# Patient Record
Sex: Female | Born: 1980 | Race: White | Hispanic: No | Marital: Married | Smoking: Never smoker
Health system: Southern US, Community
[De-identification: ages and names within clinical notes are randomized; demographics above are authoritative.]

## PROBLEM LIST (undated history)

## (undated) ENCOUNTER — Inpatient Hospital Stay (HOSPITAL_COMMUNITY): Payer: Self-pay

## (undated) DIAGNOSIS — J45909 Unspecified asthma, uncomplicated: Secondary | ICD-10-CM

## (undated) DIAGNOSIS — T7840XA Allergy, unspecified, initial encounter: Secondary | ICD-10-CM

## (undated) DIAGNOSIS — M1990 Unspecified osteoarthritis, unspecified site: Secondary | ICD-10-CM

## (undated) DIAGNOSIS — E559 Vitamin D deficiency, unspecified: Secondary | ICD-10-CM

## (undated) DIAGNOSIS — F419 Anxiety disorder, unspecified: Secondary | ICD-10-CM

## (undated) DIAGNOSIS — A6000 Herpesviral infection of urogenital system, unspecified: Secondary | ICD-10-CM

## (undated) HISTORY — DX: Unspecified asthma, uncomplicated: J45.909

## (undated) HISTORY — DX: Herpesviral infection of urogenital system, unspecified: A60.00

## (undated) HISTORY — DX: Allergy, unspecified, initial encounter: T78.40XA

## (undated) HISTORY — DX: Anxiety disorder, unspecified: F41.9

## (undated) HISTORY — DX: Vitamin D deficiency, unspecified: E55.9

## (undated) HISTORY — DX: Unspecified osteoarthritis, unspecified site: M19.90

## (undated) HISTORY — PX: BLADDER SUSPENSION: SHX72

## (undated) HISTORY — PX: TUBAL LIGATION: SHX77

## (undated) HISTORY — PX: COSMETIC SURGERY: SHX468

## (undated) HISTORY — PX: EYE SURGERY: SHX253

---
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
## 2000-11-24 ENCOUNTER — Other Ambulatory Visit: Admission: RE | Admit: 2000-11-24 | Discharge: 2000-11-24 | Payer: Self-pay | Admitting: *Deleted

## 2001-11-29 ENCOUNTER — Other Ambulatory Visit: Admission: RE | Admit: 2001-11-29 | Discharge: 2001-11-29 | Payer: Self-pay | Admitting: Obstetrics and Gynecology

## 2002-11-24 ENCOUNTER — Other Ambulatory Visit: Admission: RE | Admit: 2002-11-24 | Discharge: 2002-11-24 | Payer: Self-pay | Admitting: Obstetrics & Gynecology

## 2003-11-26 ENCOUNTER — Other Ambulatory Visit: Admission: RE | Admit: 2003-11-26 | Discharge: 2003-11-26 | Payer: Self-pay | Admitting: *Deleted

## 2007-05-08 ENCOUNTER — Encounter: Admission: RE | Admit: 2007-05-08 | Discharge: 2007-05-08 | Payer: Self-pay | Admitting: *Deleted

## 2007-11-11 ENCOUNTER — Ambulatory Visit (HOSPITAL_COMMUNITY): Admission: RE | Admit: 2007-11-11 | Discharge: 2007-11-11 | Payer: Self-pay | Admitting: Neurosurgery

## 2013-12-27 ENCOUNTER — Other Ambulatory Visit: Payer: Self-pay

## 2013-12-27 ENCOUNTER — Other Ambulatory Visit: Payer: Self-pay | Admitting: Physician Assistant

## 2013-12-27 MED ORDER — ALPRAZOLAM 0.5 MG PO TABS: 0.5000 mg | ORAL_TABLET | Freq: Three times a day (TID) | ORAL | Status: DC | PRN

## 2014-04-07 ENCOUNTER — Other Ambulatory Visit: Payer: Self-pay | Admitting: Physician Assistant

## 2014-07-22 DIAGNOSIS — T7840XA Allergy, unspecified, initial encounter: Secondary | ICD-10-CM | POA: Insufficient documentation

## 2014-07-22 DIAGNOSIS — E559 Vitamin D deficiency, unspecified: Secondary | ICD-10-CM | POA: Insufficient documentation

## 2014-07-22 DIAGNOSIS — F419 Anxiety disorder, unspecified: Secondary | ICD-10-CM | POA: Insufficient documentation

## 2014-07-22 DIAGNOSIS — J45909 Unspecified asthma, uncomplicated: Secondary | ICD-10-CM | POA: Insufficient documentation

## 2014-07-22 DIAGNOSIS — A6000 Herpesviral infection of urogenital system, unspecified: Secondary | ICD-10-CM | POA: Insufficient documentation

## 2014-07-25 ENCOUNTER — Ambulatory Visit (INDEPENDENT_AMBULATORY_CARE_PROVIDER_SITE_OTHER): Payer: Managed Care, Other (non HMO) | Admitting: Physician Assistant

## 2014-07-25 ENCOUNTER — Encounter: Payer: Self-pay | Admitting: Physician Assistant

## 2014-07-25 VITALS — BP 100/58 | HR 72 | Temp 98.1°F | Resp 16 | Ht 62.0 in | Wt 120.0 lb

## 2014-07-25 DIAGNOSIS — Z0000 Encounter for general adult medical examination without abnormal findings: Secondary | ICD-10-CM

## 2014-07-25 LAB — CBC WITH DIFFERENTIAL/PLATELET
BASOS ABS: 0.1 10*3/uL (ref 0.0–0.1)
BASOS PCT: 1 % (ref 0–1)
Eosinophils Absolute: 0.4 10*3/uL (ref 0.0–0.7)
Eosinophils Relative: 5 % (ref 0–5)
HEMATOCRIT: 36.1 % (ref 36.0–46.0)
HEMOGLOBIN: 12.5 g/dL (ref 12.0–15.0)
Lymphocytes Relative: 36 % (ref 12–46)
Lymphs Abs: 2.9 10*3/uL (ref 0.7–4.0)
MCH: 30 pg (ref 26.0–34.0)
MCHC: 34.6 g/dL (ref 30.0–36.0)
MCV: 86.6 fL (ref 78.0–100.0)
MONO ABS: 0.5 10*3/uL (ref 0.1–1.0)
Monocytes Relative: 6 % (ref 3–12)
NEUTROS PCT: 52 % (ref 43–77)
Neutro Abs: 4.2 10*3/uL (ref 1.7–7.7)
Platelets: 269 10*3/uL (ref 150–400)
RBC: 4.17 MIL/uL (ref 3.87–5.11)
RDW: 13.1 % (ref 11.5–15.5)
WBC: 8 10*3/uL (ref 4.0–10.5)

## 2014-07-25 NOTE — Patient Instructions (Addendum)
Keeping You Healthy  Get These Tests 1. Blood Pressure- Have your blood pressure checked once a year by your health care provider.  Normal blood pressure is 120/80. 2. Weight- Have your body mass index (BMI) calculated to screen for obesity.  BMI is measure of body fat based on height and weight.  You can also calculate your own BMI at GravelBags.it. 3. Cholesterol- Have your cholesterol checked every 5 years starting at age 33 then yearly starting at age 77. 12. Chlamydia, HIV, and other sexually transmitted diseases- Get screened every year until age 104, then within three months of each new sexual provider. 5. Pap Smear- Every 1-3 years; discuss with your health care provider. 6. Mammogram- Every year starting at age 58  Take these medicines  Calcium with Vitamin D-Your body needs 1200 mg of Calcium each day and [phone] IU of Vitamin D daily.  Your body can only absorb 500 mg of Calcium at a time so Calcium must be taken in 2 or 3 divided doses throughout the day.  Multivitamin with folic acid- Once daily if it is possible for you to become pregnant.  Get these Immunizations  Gardasil-Series of three doses; prevents HPV related illness such as genital warts and cervical cancer.  Menactra-Single dose; prevents meningitis.  Tetanus shot- Every 10 years.  Flu shot-Every year.  Take these steps 1. Do not smoke-Your healthcare provider can help you quit.  For tips on how to quit go to www.smokefree.gov or call 1-800 QUITNOW. 2. Be physically active- Exercise 5 days a week for at least 30 minutes.  If you are not already physically active, start slow and gradually work up to 30 minutes of moderate physical activity.  Examples of moderate activity include walking briskly, dancing, swimming, bicycling, etc. 3. Breast Cancer- A self breast exam every month is important for early detection of breast cancer.  For more information and instruction on self breast exams, ask your  healthcare provider or https://www.patel.info/. 4. Eat a healthy diet- Eat a variety of healthy foods such as fruits, vegetables, whole grains, low fat milk, low fat cheeses, yogurt, lean meats, poultry and fish, beans, nuts, tofu, etc.  For more information go to www. Thenutritionsource.org 5. Drink alcohol in moderation- Limit alcohol intake to one drink or less per day. Never drink and drive. 6. Depression- Your emotional health is as important as your physical health.  If you're feeling down or losing interest in things you normally enjoy please talk to your healthcare provider about being screened for depression. 7. Dental visit- Brush and floss your teeth twice daily; visit your dentist twice a year. 8. Eye doctor- Get an eye exam at least every 2 years. 9. Helmet use- Always wear a helmet when riding a bicycle, motorcycle, rollerblading or skateboarding. 49. Safe sex- If you may be exposed to sexually transmitted infections, use a condom. 11. Seat belts- Seat belts can save your live; always wear one. 12. Smoke/Carbon Monoxide detectors- These detectors need to be installed on the appropriate level of your home. Replace batteries at least once a year. 13. Skin cancer- When out in the sun please cover up and use sunscreen 15 SPF or higher. 14. Violence- If anyone is threatening or hurting you, please tell your healthcare provider.        Kegel Exercises The goal of Kegel exercises is to isolate and exercise your pelvic floor muscles. These muscles act as a hammock that supports the rectum, vagina, small intestine, and uterus. As the  muscles weaken, the hammock sags and these organs are displaced from their normal positions. Kegel exercises can strengthen your pelvic floor muscles and help you to improve bladder and bowel control, improve sexual response, and help reduce many problems and some discomfort during pregnancy. Kegel exercises can be done anywhere and at any  time. HOW TO PERFORM KEGEL EXERCISES 1. Locate your pelvic floor muscles. To do this, squeeze (contract) the muscles that you use when you try to stop the flow of urine. You will feel a tightness in the vaginal area (women) and a tight lift in the rectal area (men and women). 2. When you begin, contract your pelvic muscles tight for 2-5 seconds, then relax them for 2-5 seconds. This is one set. Do 4-5 sets with a short pause in between. 3. Contract your pelvic muscles for 8-10 seconds, then relax them for 8-10 seconds. Do 4-5 sets. If you cannot contract your pelvic muscles for 8-10 seconds, try 5-7 seconds and work your way up to 8-10 seconds. Your goal is 4-5 sets of 10 contractions each day. Keep your stomach, buttocks, and legs relaxed during the exercises. Perform sets of both short and long contractions. Vary your positions. Perform these contractions 3-4 times per day. Perform sets while you are:   Lying in bed in the morning.  Standing at lunch.  Sitting in the late afternoon.  Lying in bed at night. You should do 40-50 contractions per day. Do not perform more Kegel exercises per day than recommended. Overexercising can cause muscle fatigue. Continue these exercises for for at least 15-20 weeks or as directed by your caregiver. Document Released: 11/02/2012 Document Reviewed: 11/02/2012 [facility] Patient Information 2015 [place]. This information is not intended to replace advice given to you by your health care provider. Make sure you discuss any questions you have with your health care provider.

## 2014-07-25 NOTE — Progress Notes (Signed)
Complete Physical  Assessment and Plan: Allergy- take med PRN  Anxiety- continue celexa, xanax PRN  Asthma- remission  Herpesvirus 2- controlled  Vitamin D deficiency- continue medications    Discussed med's effects and SE's. Screening labs and tests as requested with regular follow-up as recommended.  HPI 33 y.o. female  presents for a complete physical.  Her blood pressure has been controlled at home, today their BP is BP: 100/58 mmHg She does workout sporadicly. She denies chest pain, shortness of breath, dizziness.  She is not on cholesterol medication and denies myalgias. Her cholesterol is at goal. The cholesterol last visit was:  LDL 67, Trigs 192, HDL 93 Last A1C in the office was: 5.4 Patient is on Vitamin D supplement.   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  Father recently passed very quickly due to Media.  She is married for 3 years, 1 daughter 74 years old and 1 step son 34, she is considering having another baby.  Since her last baby she has had some stress incontinence with coughing, sneezing, and exercise especially. Nocturia occ once, denies urgency, frequency.    She is on Celexa for anxiety and rarely takes xanax, mainly occ for sleep 1-2 times a week.   Current Medications:    Medication List       This list is accurate as of: 07/25/14  4:17 PM.  Always use your most recent med list.               ALPRAZolam 0.5 MG tablet  Commonly known as:  XANAX  TAKE 1 TABLET BY MOUTH THREE TIMES DAILY AS NEEDED     citalopram 10 MG tablet  Commonly known as:  CELEXA  Take 10 mg by mouth daily.     MULTIVITAMIN PO  Take by mouth daily.     ZYRTEC PO  Take 10 mg by mouth daily.        Health Maintenance:   Tetanus:2012 Pneumovax: Flu vaccine: 2014 Zostavax: Pap: 04/2014 at [facility]  MGM: DEXA: Colonoscopy: EGD:  Allergies: No Known Allergies Medical History:  Past Medical History  Diagnosis  Date  . Allergy   . Anxiety   . Asthma   . Herpesvirus 2   . Vitamin D deficiency    Surgical History: No past surgical history on file. Family History:  Family History  Problem Relation Age of Onset  . Hypertension Mother   . Anxiety disorder Mother   . Thyroid disease Mother   . Epilepsy Father   . ALS Father    Social History:  History  Substance Use Topics  . Smoking status: Never Smoker   . Smokeless tobacco: Never Used  . Alcohol Use: No   Review of Systems: [X]  = complains of  [ ]  = denies  General: Fatigue [ ]  Fever [ ]  Chills [ ]  Weakness [ ]   Insomnia [ ] Weight change [ ]  Night sweats [ ]   Change in appetite [ ]  Eyes: Redness [ ]  Blurred vision [ ]  Diplopia [ ]  Discharge [ ]   ENT: Congestion [ ]  Sinus Pain [ ]  Post Nasal Drip [ ]  Sore Throat [ ]  Earache [ ]  hearing loss [ ]  Tinnitus [ ]  Snoring [ ]   Cardiac: Chest pain/pressure [ ]  SOB [ ]  Orthopnea [ ]   Palpitations [ ]   Paroxysmal nocturnal dyspnea[ ]  Claudication [ ]  Edema [ ]   Pulmonary: Cough [ ]  Wheezing[ ]   SOB [ ]   Pleurisy [ ]   GI: Nausea [ ]  Vomiting[ ]  Dysphagia[ ]  Heartburn[ ]  Abdominal pain [ ]  Constipation [ ] ; Diarrhea [ ]  BRBPR [ ]  Melena[ ]  Bloating [ ]  Hemorrhoids [ ]   GU: Hematuria[ ]  Dysuria [ ]  Nocturia[ ]  Urgency [ ]   Hesitancy [ ]  Discharge [ ]  Frequency [ ]   Breast:  Breast lumps [ ]   nipple discharge [ ]    Neuro: Headaches[ ]  Vertigo[ ]  Paresthesias[ ]  Spasm [ ]  Speech changes [ ]  Incoordination [ ]   Ortho: Arthritis [ ]  Joint pain [ ]  Muscle pain [ ]  Joint swelling [ ]  Back Pain [ ]  Skin:  Rash [ ]   Pruritis [ ]  Change in skin lesion [ ]   Psych: Depression[ ]  Anxiety[x ] Confusion [ ]  Memory loss [ ]   Heme/Lypmh: Bleeding [ ]  Bruising [ ]  Enlarged lymph nodes [ ]   Endocrine: Visual blurring [ ]  Paresthesia [ ]  Polyuria [ ]  Polydypsea [ ]    Heat/cold intolerance [ ]  Hypoglycemia [ ]   Physical Exam: Estimated body mass index is 21.94 kg/(m^2) as calculated from the following:   Height as  of this encounter: 5\' 2"  (1.575 m).   Weight as of this encounter: 120 lb (54.432 kg). BP 100/58  Pulse 72  Temp(Src) 98.1 F (36.7 C)  Resp 16  Ht 5\' 2"  (1.575 m)  Wt 120 lb (54.432 kg)  BMI 21.94 kg/m2 General Appearance: Well nourished, in no apparent distress. Eyes: PERRLA, EOMs, conjunctiva no swelling or erythema, normal fundi and vessels. Sinuses: No Frontal/maxillary tenderness ENT/Mouth: Ext aud canals clear, normal light reflex with TMs without erythema, bulging.  Good dentition. No erythema, swelling, or exudate on post pharynx. Tonsils not swollen or erythematous. Hearing normal.  Neck: Supple, thyroid normal. No bruits Respiratory: Respiratory effort normal, BS equal bilaterally without rales, rhonchi, wheezing or stridor. Cardio: RRR without murmurs, rubs or gallops. Brisk peripheral pulses without edema.  Chest: symmetric, with normal excursions and percussion. Breasts: defer Abdomen: Soft, +BS. Non tender, no guarding, rebound, hernias, masses, or organomegaly. .  Lymphatics: Non tender without lymphadenopathy.  Genitourinary: defer Musculoskeletal: Full ROM all peripheral extremities,5/5 strength, and normal gait. Skin: Warm, dry without rashes, lesions, ecchymosis.  Neuro: Cranial nerves intact, reflexes equal bilaterally. Normal muscle tone, no cerebellar symptoms. Sensation intact.  Psych: Awake and oriented X 3, normal affect, Insight and Judgment appropriate.   EKG: declines   Journiee, Feldkamp 4:09 PM

## 2014-07-26 LAB — BASIC METABOLIC PANEL WITH GFR
BUN: 11 mg/dL (ref 6–23)
CO2: 27 meq/L (ref 19–32)
CREATININE: 0.7 mg/dL (ref 0.50–1.10)
Calcium: 9.4 mg/dL (ref 8.4–10.5)
Chloride: 100 mEq/L (ref 96–112)
GFR, Est Non African American: >89 mL/min
Glucose, Bld: 111 mg/dL — ABNORMAL HIGH (ref 70–99)
Potassium: 3.9 mEq/L (ref 3.5–5.3)
SODIUM: 138 meq/L (ref 135–145)

## 2014-07-26 LAB — HEPATIC FUNCTION PANEL
ALBUMIN: 4.6 g/dL (ref 3.5–5.2)
ALT: 11 U/L (ref 0–35)
AST: 17 U/L (ref 0–37)
Alkaline Phosphatase: 59 U/L (ref 39–117)
BILIRUBIN INDIRECT: 0.3 mg/dL (ref 0.2–1.2)
Bilirubin, Direct: 0.1 mg/dL (ref 0.0–0.3)
TOTAL PROTEIN: 7.2 g/dL (ref 6.0–8.3)
Total Bilirubin: 0.4 mg/dL (ref 0.2–1.2)

## 2014-07-26 LAB — HEMOGLOBIN A1C
Hgb A1c MFr Bld: 5.5 % (ref <5.7)
Mean Plasma Glucose: 111 mg/dL (ref <117)

## 2014-07-26 LAB — LIPID PANEL
CHOL/HDL RATIO: 3.6 ratio
CHOLESTEROL: 143 mg/dL (ref 0–200)
HDL: 40 mg/dL (ref >39)
LDL Cholesterol: 81 mg/dL (ref 0–99)
Triglycerides: 112 mg/dL (ref <150)
VLDL: 22 mg/dL (ref 0–40)

## 2014-07-26 LAB — TSH: TSH: 0.817 u[IU]/mL (ref 0.350–4.500)

## 2014-07-26 LAB — VITAMIN B12: VITAMIN B 12: 386 pg/mL (ref 211–911)

## 2014-07-26 LAB — IRON AND TIBC
%SAT: 14 % — AB (ref 20–55)
Iron: 51 ug/dL (ref 42–145)
TIBC: 359 ug/dL (ref 250–470)
UIBC: 308 ug/dL (ref 125–400)

## 2014-07-26 LAB — VITAMIN D 25 HYDROXY (VIT D DEFICIENCY, FRACTURES): VIT D 25 HYDROXY: 40 ng/mL (ref 30–89)

## 2014-07-26 LAB — FERRITIN: FERRITIN: 19 ng/mL (ref 10–291)

## 2014-07-26 LAB — INSULIN, FASTING: INSULIN FASTING, SERUM: 20.2 u[IU]/mL — AB (ref 2.0–19.6)

## 2014-07-26 LAB — MAGNESIUM: MAGNESIUM: 2 mg/dL (ref 1.5–2.5)

## 2014-11-30 NOTE — L&D Delivery Note (Signed)
Delivery Note  First Stage: Labor induction started @ 0200 Onset of active labor: 1000 Analgesia /Anesthesia intrapartum: epidural AROM - A at 1000  Second Stage - CNM and Dr Ronita Hipps at bedside Complete dilation at 1315 Onset of pushing at 1320 FHR x2 second stage - category 1  Delivery of a viable TWIN A - FEMALE at 1406 by CNM in LOA position no nuchal cord Cord double clamped after cessation of pulsation, cut by FOB  Bedside sono by Dr Ronita Hipps - vtx presentation of TWIN B AROM - 1408 - clear / vtx -2 station Ctx decrease to 10 minutes apart - pitocin increased   delivery of a viable TWIN B - FEMALE at 1506 by CNM in LOA position large amount of blood and clots C/w abruption at time of newborn delivery no nuchal cord Cord double clamped and cut by provider - to warmer for initial assessment and supportive care - NICU team called  Arterial cord gas obtained - twin B cord: 7.11 ph Cord blood sample collected Twin A cord & placed in A-warmer and Twin B cord sample collected & placed in B-warmer  Third Stage: Placentas: twin-B delivered intact with @ 1512 just prior to twin-A placenta Placenta disposition: pathology Uterine tone boggy / bleeding moderate - pitocin IVF bolus / uterine massage / cytotec 850mcg PR   2nd degree perineal laceration identified  Anesthesia for repair: epidural Repair 3-0 vicryl and 4-0 vicryl Est. Blood Loss (mL): 1093AT  Complications: abruption Twin - B with acute bleeding at delivery  Mom to postpartum.  Baby to Couplet care / Skin to Skin.  Newborn- A: Birth Weight: 6-12 Apgar Scores: 8-9 Feeding: breast and bottle  Newborn- B: Birth Weight: 6-6  Apgar Scores: 2-7-9 Feeding: breast and bottle  Artelia Laroche CNM, MSN, [place] 07/22/2015, 4:22 PM

## 2014-12-31 ENCOUNTER — Other Ambulatory Visit: Payer: Self-pay

## 2014-12-31 MED ORDER — ACYCLOVIR 800 MG PO TABS: ORAL_TABLET | ORAL | Status: DC

## 2015-01-16 LAB — OB RESULTS CONSOLE HIV ANTIBODY (ROUTINE TESTING): HIV: NONREACTIVE

## 2015-01-16 LAB — OB RESULTS CONSOLE ANTIBODY SCREEN: Antibody Screen: NEGATIVE

## 2015-01-16 LAB — OB RESULTS CONSOLE ABO/RH: RH Type: POSITIVE

## 2015-01-16 LAB — OB RESULTS CONSOLE RPR: RPR: NONREACTIVE

## 2015-01-16 LAB — OB RESULTS CONSOLE HEPATITIS B SURFACE ANTIGEN: Hepatitis B Surface Ag: NEGATIVE

## 2015-01-16 LAB — OB RESULTS CONSOLE RUBELLA ANTIBODY, IGM: Rubella: IMMUNE

## 2015-02-04 LAB — OB RESULTS CONSOLE GC/CHLAMYDIA
CHLAMYDIA, DNA PROBE: NEGATIVE
GC PROBE AMP, GENITAL: NEGATIVE

## 2015-05-06 ENCOUNTER — Encounter (HOSPITAL_COMMUNITY): Payer: Self-pay | Admitting: *Deleted

## 2015-05-06 ENCOUNTER — Inpatient Hospital Stay (HOSPITAL_COMMUNITY)
Admission: AD | Admit: 2015-05-06 | Discharge: 2015-05-06 | Disposition: A | Discharge status: Home / Self Care | Payer: Managed Care, Other (non HMO) | Source: Ambulatory Visit | Attending: Obstetrics and Gynecology | Admitting: Obstetrics and Gynecology

## 2015-05-06 DIAGNOSIS — R109 Unspecified abdominal pain: Secondary | ICD-10-CM | POA: Diagnosis present

## 2015-05-06 DIAGNOSIS — Z3A26 26 weeks gestation of pregnancy: Secondary | ICD-10-CM | POA: Diagnosis not present

## 2015-05-06 DIAGNOSIS — M549 Dorsalgia, unspecified: Secondary | ICD-10-CM | POA: Diagnosis not present

## 2015-05-06 DIAGNOSIS — O9989 Other specified diseases and conditions complicating pregnancy, childbirth and the puerperium: Secondary | ICD-10-CM | POA: Insufficient documentation

## 2015-05-06 DIAGNOSIS — K5900 Constipation, unspecified: Secondary | ICD-10-CM | POA: Insufficient documentation

## 2015-05-06 MED ORDER — MAGNESIUM 200 MG PO TABS: 1.0000 | ORAL_TABLET | Freq: Every day | ORAL | Status: DC

## 2015-05-06 NOTE — MAU Note (Signed)
Pt reports some contractions since last night, some nausea. Mucous discharge.

## 2015-05-06 NOTE — MAU Provider Note (Signed)
  History     CSN: [id number]  Arrival date and time: 05/06/15 2012 Nurse call to provider @ 2038 Provider here to see patient at 2045   No chief complaint on file.  HPI  Nausea Passed mucus plug Some cramping x 2 days  Past Medical History  Diagnosis Date  . Allergy   . Anxiety   . Asthma   . Herpesvirus 2   . Vitamin D deficiency     No past surgical history on file.  Family History  Problem Relation Age of Onset  . Hypertension Mother   . Anxiety disorder Mother   . Thyroid disease Mother   . Epilepsy Father   . ALS Father     History  Substance Use Topics  . Smoking status: Never Smoker   . Smokeless tobacco: Never Used  . Alcohol Use: No    Allergies: No Known Allergies  Prescriptions prior to admission  Medication Sig Dispense Refill Last Dose  . acyclovir (ZOVIRAX) 800 MG tablet Take one tablet twice daily for five days as needed for outbreak 30 tablet 3   . ALPRAZolam (XANAX) 0.5 MG tablet TAKE 1 TABLET BY MOUTH THREE TIMES DAILY AS NEEDED 90 tablet 0 Taking  . Cetirizine HCl (ZYRTEC PO) Take 10 mg by mouth daily.   Taking  . citalopram (CELEXA) 10 MG tablet Take 10 mg by mouth daily.   Taking  . Multiple Vitamins-Minerals (MULTIVITAMIN PO) Take by mouth daily.   Taking    ROS  Cramps Some braxton-hicks No bleeding Passed mucus discharge this afternoon - thinks it is her mucus plug and is a sign of labor  Physical Exam   Blood pressure 107/72, pulse 90, temperature 98.4 F (36.9 C), temperature source Oral, resp. rate 18, height 5\' 4"  (1.626 m), weight 68.493 kg (151 lb), SpO2 100 %.  Physical Exam  Anxious / NAD or pain Abdomen soft and non-tender / gravid and tight with short waist SSE: white vaginal discharge - no mucus seen            Cervix appear closed / mucus in os / no bleeding VE: closed / soft / no LUSD / cervical length~ 3cm  FHR A - 155 FHR B - 150  toco - no ctx  MAU Course  Procedures   Assessment and Plan  26  weeks - vaginal discharge No evidence of PTL Backache Constipation  1) rest in evening / tub soaks / pool access 2) MORE water - not drinking enough water daily 3) magnesium 200-400mg  daily for constipation management 4) DC home  Artelia Laroche 05/06/2015, 9:02 PM

## 2015-07-03 LAB — OB RESULTS CONSOLE GBS: STREP GROUP B AG: NEGATIVE

## 2015-07-15 ENCOUNTER — Telehealth (HOSPITAL_COMMUNITY): Payer: Self-pay | Admitting: *Deleted

## 2015-07-15 ENCOUNTER — Encounter (HOSPITAL_COMMUNITY): Payer: Self-pay | Admitting: *Deleted

## 2015-07-15 NOTE — Telephone Encounter (Signed)
Preadmission screen  

## 2015-07-16 ENCOUNTER — Telehealth (HOSPITAL_COMMUNITY): Payer: Self-pay | Admitting: *Deleted

## 2015-07-16 ENCOUNTER — Encounter (HOSPITAL_COMMUNITY): Payer: Self-pay | Admitting: *Deleted

## 2015-07-16 NOTE — Telephone Encounter (Signed)
Preadmission screen  

## 2015-07-22 ENCOUNTER — Encounter (HOSPITAL_COMMUNITY): Payer: Self-pay

## 2015-07-22 ENCOUNTER — Inpatient Hospital Stay (HOSPITAL_COMMUNITY)
Admission: RE | Admit: 2015-07-22 | Discharge: 2015-07-24 | DRG: 774 | Disposition: A | Discharge status: Home / Self Care | Payer: Managed Care, Other (non HMO) | Source: Ambulatory Visit | Attending: Obstetrics and Gynecology | Admitting: Obstetrics and Gynecology

## 2015-07-22 ENCOUNTER — Inpatient Hospital Stay (HOSPITAL_COMMUNITY): Payer: Managed Care, Other (non HMO) | Admitting: Anesthesiology

## 2015-07-22 DIAGNOSIS — O26893 Other specified pregnancy related conditions, third trimester: Secondary | ICD-10-CM | POA: Diagnosis present

## 2015-07-22 DIAGNOSIS — Z3A37 37 weeks gestation of pregnancy: Secondary | ICD-10-CM | POA: Diagnosis present

## 2015-07-22 DIAGNOSIS — IMO0001 Reserved for inherently not codable concepts without codable children: Secondary | ICD-10-CM

## 2015-07-22 DIAGNOSIS — O322XX2 Maternal care for transverse and oblique lie, fetus 2: Secondary | ICD-10-CM | POA: Diagnosis present

## 2015-07-22 DIAGNOSIS — D62 Acute posthemorrhagic anemia: Secondary | ICD-10-CM | POA: Diagnosis present

## 2015-07-22 DIAGNOSIS — O9962 Diseases of the digestive system complicating childbirth: Secondary | ICD-10-CM | POA: Diagnosis present

## 2015-07-22 DIAGNOSIS — K219 Gastro-esophageal reflux disease without esophagitis: Secondary | ICD-10-CM | POA: Diagnosis present

## 2015-07-22 DIAGNOSIS — O9081 Anemia of the puerperium: Secondary | ICD-10-CM | POA: Diagnosis present

## 2015-07-22 DIAGNOSIS — O4593 Premature separation of placenta, unspecified, third trimester: Secondary | ICD-10-CM | POA: Diagnosis present

## 2015-07-22 DIAGNOSIS — E559 Vitamin D deficiency, unspecified: Secondary | ICD-10-CM | POA: Diagnosis present

## 2015-07-22 DIAGNOSIS — Z8249 Family history of ischemic heart disease and other diseases of the circulatory system: Secondary | ICD-10-CM

## 2015-07-22 DIAGNOSIS — O30043 Twin pregnancy, dichorionic/diamniotic, third trimester: Secondary | ICD-10-CM | POA: Diagnosis present

## 2015-07-22 LAB — CBC
HCT: 34.8 % — ABNORMAL LOW (ref 36.0–46.0)
HEMATOCRIT: 33.5 % — AB (ref 36.0–46.0)
HEMATOCRIT: 31.4 % — AB (ref 36.0–46.0)
HEMOGLOBIN: 11.2 g/dL — AB (ref 12.0–15.0)
Hemoglobin: 11.7 g/dL — ABNORMAL LOW (ref 12.0–15.0)
Hemoglobin: 10.5 g/dL — ABNORMAL LOW (ref 12.0–15.0)
MCH: 31.2 pg (ref 26.0–34.0)
MCH: 31.5 pg (ref 26.0–34.0)
MCH: 31.2 pg (ref 26.0–34.0)
MCHC: 33.6 g/dL (ref 30.0–36.0)
MCHC: 33.4 g/dL (ref 30.0–36.0)
MCHC: 33.4 g/dL (ref 30.0–36.0)
MCV: 92.8 fL (ref 78.0–100.0)
MCV: 94.1 fL (ref 78.0–100.0)
MCV: 93.2 fL (ref 78.0–100.0)
Platelets: 143 10*3/uL — ABNORMAL LOW (ref 150–400)
Platelets: 142 10*3/uL — ABNORMAL LOW (ref 150–400)
Platelets: 142 10*3/uL — ABNORMAL LOW (ref 150–400)
RBC: 3.75 MIL/uL — ABNORMAL LOW (ref 3.87–5.11)
RBC: 3.56 MIL/uL — ABNORMAL LOW (ref 3.87–5.11)
RBC: 3.37 MIL/uL — AB (ref 3.87–5.11)
RDW: 14.1 % (ref 11.5–15.5)
RDW: 14.3 % (ref 11.5–15.5)
RDW: 14.2 % (ref 11.5–15.5)
WBC: 9.2 10*3/uL (ref 4.0–10.5)
WBC: 8.7 10*3/uL (ref 4.0–10.5)
WBC: 17.9 10*3/uL — ABNORMAL HIGH (ref 4.0–10.5)

## 2015-07-22 LAB — RPR: RPR Ser Ql: NONREACTIVE

## 2015-07-22 LAB — TYPE AND SCREEN
ABO/RH(D): A POS
Antibody Screen: NEGATIVE

## 2015-07-22 LAB — ABO/RH: ABO/RH(D): A POS

## 2015-07-22 MED ORDER — ONDANSETRON HCL 4 MG PO TABS
4.0000 mg | ORAL_TABLET | ORAL | Status: DC | PRN
  Administered 2015-07-22: 4 mg via ORAL
  Filled 2015-07-22: qty 1

## 2015-07-22 MED ORDER — PHENYLEPHRINE 40 MCG/ML (10ML) SYRINGE FOR IV PUSH (FOR BLOOD PRESSURE SUPPORT)
80.0000 ug | PREFILLED_SYRINGE | INTRAVENOUS | Status: DC | PRN
  Filled 2015-07-22: qty 2

## 2015-07-22 MED ORDER — DIPHENHYDRAMINE HCL 50 MG/ML IJ SOLN
12.5000 mg | INTRAMUSCULAR | Status: DC | PRN
  Administered 2015-07-22: 12.5 mg via INTRAVENOUS

## 2015-07-22 MED ORDER — OXYTOCIN BOLUS FROM INFUSION
500.0000 mL | INTRAVENOUS | Status: DC
  Administered 2015-07-22: 500 mL via INTRAVENOUS

## 2015-07-22 MED ORDER — OXYTOCIN 40 UNITS IN LACTATED RINGERS INFUSION - SIMPLE MED: 62.5000 mL/h | INTRAVENOUS | Status: DC

## 2015-07-22 MED ORDER — LACTATED RINGERS IV SOLN
500.0000 mL | INTRAVENOUS | Status: DC | PRN
  Administered 2015-07-22: 500 mL via INTRAVENOUS

## 2015-07-22 MED ORDER — WITCH HAZEL-GLYCERIN EX PADS: 1.0000 "application " | MEDICATED_PAD | CUTANEOUS | Status: DC | PRN

## 2015-07-22 MED ORDER — OXYCODONE-ACETAMINOPHEN 5-325 MG PO TABS: 1.0000 | ORAL_TABLET | ORAL | Status: DC | PRN

## 2015-07-22 MED ORDER — SENNOSIDES-DOCUSATE SODIUM 8.6-50 MG PO TABS
2.0000 | ORAL_TABLET | ORAL | Status: DC
  Administered 2015-07-22 – 2015-07-24 (×2): 2 via ORAL
  Filled 2015-07-22 (×2): qty 2

## 2015-07-22 MED ORDER — SIMETHICONE 80 MG PO CHEW
80.0000 mg | CHEWABLE_TABLET | ORAL | Status: DC | PRN
Start: 2015-07-22 — End: 2015-07-24

## 2015-07-22 MED ORDER — CITRIC ACID-SODIUM CITRATE 334-500 MG/5ML PO SOLN: 30.0000 mL | ORAL | Status: DC | PRN

## 2015-07-22 MED ORDER — LIDOCAINE HCL (PF) 1 % IJ SOLN
30.0000 mL | INTRAMUSCULAR | Status: DC | PRN
  Filled 2015-07-22: qty 30

## 2015-07-22 MED ORDER — ACETAMINOPHEN 325 MG PO TABS
650.0000 mg | ORAL_TABLET | ORAL | Status: DC | PRN
  Administered 2015-07-22 – 2015-07-23 (×3): 650 mg via ORAL
  Filled 2015-07-22 (×3): qty 2

## 2015-07-22 MED ORDER — FENTANYL 2.5 MCG/ML BUPIVACAINE 1/10 % EPIDURAL INFUSION (WH - ANES): 11.0000 mL/h | INTRAMUSCULAR | Status: DC | PRN

## 2015-07-22 MED ORDER — DIBUCAINE 1 % RE OINT: 1.0000 "application " | TOPICAL_OINTMENT | RECTAL | Status: DC | PRN

## 2015-07-22 MED ORDER — LANOLIN HYDROUS EX OINT: TOPICAL_OINTMENT | CUTANEOUS | Status: DC | PRN

## 2015-07-22 MED ORDER — IBUPROFEN 600 MG PO TABS
600.0000 mg | ORAL_TABLET | Freq: Four times a day (QID) | ORAL | Status: DC
  Administered 2015-07-22 – 2015-07-24 (×7): 600 mg via ORAL
  Filled 2015-07-22 (×7): qty 1

## 2015-07-22 MED ORDER — LIDOCAINE HCL (PF) 1 % IJ SOLN
INTRAMUSCULAR | Status: DC | PRN
  Administered 2015-07-22: 3 mL via EPIDURAL
  Administered 2015-07-22: 4 mL via EPIDURAL

## 2015-07-22 MED ORDER — MISOPROSTOL 200 MCG PO TABS
ORAL_TABLET | ORAL | Status: AC
  Administered 2015-07-22: 800 ug via RECTAL
  Filled 2015-07-22: qty 4

## 2015-07-22 MED ORDER — ONDANSETRON HCL 4 MG/2ML IJ SOLN: 4.0000 mg | INTRAMUSCULAR | Status: DC | PRN

## 2015-07-22 MED ORDER — EPHEDRINE 5 MG/ML INJ
10.0000 mg | INTRAVENOUS | Status: DC | PRN
  Filled 2015-07-22: qty 2

## 2015-07-22 MED ORDER — OXYCODONE-ACETAMINOPHEN 5-325 MG PO TABS: 2.0000 | ORAL_TABLET | ORAL | Status: DC | PRN

## 2015-07-22 MED ORDER — FENTANYL 2.5 MCG/ML BUPIVACAINE 1/10 % EPIDURAL INFUSION (WH - ANES)
14.0000 mL/h | INTRAMUSCULAR | Status: DC | PRN
  Administered 2015-07-22: 11 mL/h via EPIDURAL
  Administered 2015-07-22: 14 mL/h via EPIDURAL
  Filled 2015-07-22: qty 125

## 2015-07-22 MED ORDER — MAGNESIUM OXIDE 400 (241.3 MG) MG PO TABS
200.0000 mg | ORAL_TABLET | Freq: Every day | ORAL | Status: DC
  Administered 2015-07-22 – 2015-07-23 (×2): 200 mg via ORAL
  Filled 2015-07-22 (×3): qty 0.5

## 2015-07-22 MED ORDER — OXYTOCIN 40 UNITS IN LACTATED RINGERS INFUSION - SIMPLE MED
1.0000 m[IU]/min | INTRAVENOUS | Status: DC
Start: 2015-07-22 — End: 2015-07-22
  Administered 2015-07-22: 1.333 m[IU]/min via INTRAVENOUS

## 2015-07-22 MED ORDER — DIPHENHYDRAMINE HCL 50 MG/ML IJ SOLN
12.5000 mg | INTRAMUSCULAR | Status: DC | PRN
  Filled 2015-07-22: qty 1

## 2015-07-22 MED ORDER — ACETAMINOPHEN 325 MG PO TABS: 650.0000 mg | ORAL_TABLET | ORAL | Status: DC | PRN

## 2015-07-22 MED ORDER — HYDROXYZINE HCL 10 MG PO TABS
10.0000 mg | ORAL_TABLET | Freq: Three times a day (TID) | ORAL | Status: DC | PRN
  Filled 2015-07-22: qty 1

## 2015-07-22 MED ORDER — OXYTOCIN 40 UNITS IN LACTATED RINGERS INFUSION - SIMPLE MED
INTRAVENOUS | Status: AC
  Administered 2015-07-22: 1.333 m[IU]/min via INTRAVENOUS
  Filled 2015-07-22: qty 1000

## 2015-07-22 MED ORDER — OXYCODONE-ACETAMINOPHEN 5-325 MG PO TABS
2.0000 | ORAL_TABLET | ORAL | Status: DC | PRN
  Administered 2015-07-24 (×2): 2 via ORAL
  Filled 2015-07-22 (×2): qty 2

## 2015-07-22 MED ORDER — LACTATED RINGERS IV SOLN
INTRAVENOUS | Status: DC
  Administered 2015-07-22: 1000 mL via INTRAVENOUS

## 2015-07-22 MED ORDER — BENZOCAINE-MENTHOL 20-0.5 % EX AERO
1.0000 "application " | INHALATION_SPRAY | CUTANEOUS | Status: DC | PRN
  Filled 2015-07-22 (×2): qty 56

## 2015-07-22 MED ORDER — PHENYLEPHRINE 40 MCG/ML (10ML) SYRINGE FOR IV PUSH (FOR BLOOD PRESSURE SUPPORT)
80.0000 ug | PREFILLED_SYRINGE | INTRAVENOUS | Status: DC | PRN
  Filled 2015-07-22: qty 2
  Filled 2015-07-22: qty 20

## 2015-07-22 MED ORDER — MISOPROSTOL 200 MCG PO TABS
800.0000 ug | ORAL_TABLET | Freq: Once | ORAL | Status: AC
  Administered 2015-07-22: 800 ug via RECTAL

## 2015-07-22 MED ORDER — ONDANSETRON HCL 4 MG/2ML IJ SOLN: 4.0000 mg | Freq: Four times a day (QID) | INTRAMUSCULAR | Status: DC | PRN

## 2015-07-22 MED ORDER — HYDROCHLOROTHIAZIDE 25 MG PO TABS
25.0000 mg | ORAL_TABLET | Freq: Every day | ORAL | Status: DC
  Administered 2015-07-23: 25 mg via ORAL
  Filled 2015-07-22: qty 1

## 2015-07-22 NOTE — Plan of Care (Signed)
Problem: Consults Goal: Birthing Suites Patient Information Press F2 to bring up selections list  Outcome: Completed/Met Date Met:  07/22/15  Pt 37-[redacted] weeks EGA

## 2015-07-22 NOTE — Plan of Care (Signed)
Problem: Consults Goal: Birthing Suites Patient Information Press F2 to bring up selections list   Pt 37-[redacted] weeks EGA     

## 2015-07-22 NOTE — Anesthesia Preprocedure Evaluation (Addendum)
Anesthesia Evaluation  Patient identified by MRN, date of birth, ID band Patient awake    Reviewed: Allergy & Precautions, Patient's Chart, lab work & pertinent test results  Airway Mallampati: III  TM Distance: >3 FB Neck ROM: Full    Dental no notable dental hx. (+) Teeth Intact   Pulmonary asthma ,  breath sounds clear to auscultation  Pulmonary exam normal       Cardiovascular negative cardio ROS Normal cardiovascular examRhythm:Regular Rate:Normal     Neuro/Psych PSYCHIATRIC DISORDERS negative neurological ROS     GI/Hepatic Neg liver ROS, GERD-  Medicated and Controlled,  Endo/Other  Obesity  Renal/GU negative Renal ROS  negative genitourinary   Musculoskeletal negative musculoskeletal ROS (+)   Abdominal (+) + obese,   Peds  Hematology  (+) anemia , Thrombocytopenia- mild   Anesthesia Other Findings   Reproductive/Obstetrics (+) Pregnancy Di/Di Twin gestation 37 weeks Vertex/Oblique presentation                            Anesthesia Physical Anesthesia Plan  ASA: II  Anesthesia Plan:    Post-op Pain Management:    Induction:   Airway Management Planned: Natural Airway  Additional Equipment:   Intra-op Plan:   Post-operative Plan:   Informed Consent: I have reviewed the patients History and Physical, chart, labs and discussed the procedure including the risks, benefits and alternatives for the proposed anesthesia with the patient or authorized representative who has indicated his/her understanding and acceptance.     Plan Discussed with: Anesthesiologist  Anesthesia Plan Comments:         Anesthesia Quick Evaluation

## 2015-07-22 NOTE — Lactation Note (Signed)
This note was copied from the chart of [place].  Lactation Consultation Note Initial visit at 6 hours of age.  Mom reports babies both just had 15 minutes feedings and did well.  Mom did not call out for latch assist as requested.  Babies quiet in crib.  MOm would like to feed both at the same time with next feeding.  Discussed that some moms will work on individual feedings until she is more comfortable with feeding at the same time.  Discussed mom will need extra hands to get babies into position.  Encouraged mom to attempt feedings and ask questions while she is here so we can assist her.  Mom is tired and not very receptive to teaching at this time.  [facility] LC resources given and discussed.  Encouraged to feed with early cues on demand.  Early newborn behavior discussed.  Hand expression demonstrated with colostrum visible.  Mom to call for assist as needed.    Patient Name: Felicia Cole Today's Date: 07/22/2015 Reason for consult: Initial assessment   Maternal Data Has patient been taught Hand Expression?: Yes Does the patient have breastfeeding experience prior to this delivery?: Yes  Feeding Feeding Type: Breast Fed Length of feed: 15 min  LATCH Score/Interventions                Intervention(s): Breastfeeding basics reviewed     Lactation Tools Discussed/Used     Consult Status Consult Status: Follow-up Date: 07/23/15 Follow-up type: In-patient    Kendell Bane Justine Null 07/22/2015, 9:37 PM

## 2015-07-22 NOTE — Progress Notes (Signed)
S:  Ctx painful - wants epidural when she can get it - prefers prior to AROM  O:  VS: Blood pressure 113/78, pulse 82, temperature 97.5 F (36.4 C), temperature source Oral, resp. rate 18, height 5\' 1"  (1.549 m), weight 77.111 kg (170 lb), SpO2 100 %.        FHR A : baseline 145 / variability moderate / accelerations + / no decelerations        FHR B: baseline 140 / variability moderate / accelerations + / no decelerations        Toco: contractions every 2-3 minutes / pitocin @ 14 mu/min        Cervix : 5-6 cm / 90% / vtx  0station        Membranes: BBOW  A: active labor     FHR category 1 x 2  P: place epidural then foley      AROM after comfortable with epidural   Artelia Laroche CNM, MSN, Gretchen Portela 07/22/2015, [id number]

## 2015-07-22 NOTE — Lactation Note (Signed)
This note was copied from the chart of [place]. Lactation Consultation Note Initial visit at 6 hours of age.  Mom reports 3 months experience with breastfeeding older child.  Mom reports both babies attempted but [name] didn't really latch for a feeding yet.  Felicia Cole is [name] and latched for a few minutes.  Mom holding Felicia Cole in her arms baby asleep.  Offered to assist with latch and hand expression. Mom declines due to room full of visitors.  LC to attempt follow up later.    Patient Name: Felicia Cole [id number] Date: 07/22/2015 Reason for consult: Initial assessment   Maternal Data Has patient been taught Hand Expression?: Yes Does the patient have breastfeeding experience prior to this delivery?: Yes  Feeding Feeding Type: Breast Fed Length of feed: 10 min  LATCH Score/Interventions Latch: Repeated attempts needed to sustain latch, nipple held in mouth throughout feeding, stimulation needed to elicit sucking reflex.  Audible Swallowing: None  Type of Nipple: Everted at rest and after stimulation  Comfort (Breast/Nipple): Soft / non-tender     Hold (Positioning): Assistance needed to correctly position infant at breast and maintain latch. Intervention(s): Breastfeeding basics reviewed  LATCH Score: 6  Lactation Tools Discussed/Used     Consult Status Consult Status: Follow-up Date: 07/23/15 Follow-up type: In-patient    [place], Justine Null 07/22/2015, 8:10 PM

## 2015-07-22 NOTE — H&P (Signed)
OB ADMISSION/ HISTORY & PHYSICAL:  Admission Date: 07/22/2015 12:28 AM  Admit Diagnosis: 37.0 weeks / di-di twins / twin B with single umbilical artery (2vessel umbilical cord) / dependent edema  Felicia Cole is a 34 y.o. female presenting for induction of labor per MFM recommendation with favorable Bishop Score and previous SVD  Prenatal History: G2P1001   EDC : 08/12/2015 Prenatal care at [facility]  Primary Ob Provider: Cora Collum / Dr Ronita Hipps collaborative physician for twin mangement Prenatal course complicated by twin gestation - di-di / single umbilical artery TWIN B with normal fetal echo / dependent edema / anxiety and physical discomforts of twin gestation  sono- concordant growth with normal AFI x 2 / twin A presenting vtx / twin B (variable) breech last formal sono and oblique breech with vtx R far lateral upper quad  Prenatal Labs: ABO, Rh: A/Positive/-- (02/17 0000) Antibody: Negative (02/17 0000) Rubella: Immune (02/17 0000)  RPR: Nonreactive (02/17 0000)  HBsAg: Negative (02/17 0000)  HIV: Non-reactive (02/17 0000)  GTT: NL GBS: Negative (08/03 0000)   Medical / Surgical History :  Past medical history:  Past Medical History  Diagnosis Date  . Allergy   . Anxiety   . Asthma   . Herpesvirus 2   . Vitamin D deficiency      Past surgical history: History reviewed. No pertinent past surgical history.  Family History:  Family History  Problem Relation Age of Onset  . Hypertension Mother   . Anxiety disorder Mother   . Thyroid disease Mother   . Epilepsy Father   . ALS Father   . Cancer Maternal Aunt     breast  . Heart disease Maternal Grandmother   . Cancer Maternal Grandmother     breast  . Heart disease Maternal Grandfather   . Cancer Paternal Grandmother     ovarian  . Heart disease Paternal Grandmother   . Heart disease Paternal Grandfather   . Hypertension Paternal Grandfather      Social History:  reports that she has  never smoked. She has never used smokeless tobacco. She reports that she does not drink alcohol or use illicit drugs.   Allergies: Bee venom and Shellfish allergy    Current Medications at time of admission:  Prior to Admission medications   Medication Sig Start Date End Date Taking? Authorizing Provider  acetaminophen (TYLENOL) 500 MG tablet Take 1,000 mg by mouth every 6 (six) hours as needed for mild pain, moderate pain or headache.    Historical Provider, MD  Cetirizine HCl (ZYRTEC PO) Take 10 mg by mouth daily.    Historical Provider, MD  citalopram (CELEXA) 10 MG tablet Take 10 mg by mouth daily.    Historical Provider, MD  EPINEPHrine (EPIPEN 2-PAK IJ) Inject 1 applicator as directed once as needed (severe allergic reaction).    Historical Provider, MD  Magnesium 200 MG TABS Take 1-2 tablets (200-400 mg total) by mouth daily at 6 (six) AM. 05/06/15   Artelia Laroche, CNM  Prenatal Vit-Fe Fumarate-FA (PRENATAL MULTIVITAMIN) TABS tablet Take 2 tablets by mouth daily at 12 noon.    Historical Provider, MD   Review of Systems: Active FM Mild irregular ctx  Physical Exam:  VS: Height 5\' 1"  (1.549 m), weight 77.111 kg (170 lb).  General: alert and oriented, appears pleasant and uncomfortable Heart: RRR Lungs: Clear lung fields Abdomen: Gravid, soft and non-tender, non-distended / uterus: gravid Extremities: 3+ edema  Genitalia / VE: Dilation: 4  Effacement (%): 90 Station: -1 Exam by:: Artelia Laroche CNM  FHR A: baseline rate 145 / variability moderate / accelerations present / no decels FHR B: baseline rate 140 / variability moderate / accelerations present / no decels TOCO: rare mild ctx  Assessment: 37 week Di-DI twin gestation Single umbilical artery twin B Favorable Bishop Score Dependent edema Presentation twin A - Vtx / twin B - oblique lie FHR category 1 for both babies   Plan:  Admit Pitocin low dose Epidural in active labor AROM to augment labor AFTER  epidural  CNM and MD co-management intrapartum Attempt vaginal birth x 2  Dr Maudry Diego notified of admission / Dr Ronita Hipps to be update in AM when patient laboring   Artelia Laroche CNM, MSN, [facility] 07/22/2015, 1:34 AM

## 2015-07-22 NOTE — Consult Note (Signed)
Neonatology Note:  Attendance at Code Apgar:   Our team responded to a Code Apgar call to room # 171 following NSVD of this Twin B, due to infant with poor tone and poor resp effort following delivery. The requesting physician was Dr. Ronita Hipps. The mother is a G2P1 A pos, GBS neg with di-di conconcordant twin gestation at 55 0/[redacted] weeks GA. ROM occurred 1 hour PTD and the fluid was clear.  At delivery, this baby had some resp effort, but became floppy after being placed on mother's chest. The OB nursing staff in attendance gave vigorous stimulation and a Code Apgar was called. Our team arrived at 2 minutes of life, at which time the baby was dusky but breathing, with somewhat decreased tone and normal HR. We gave stimulation. He was noted to have good air exchange and clear lungs, so was observed. His O2 saturation was about 82% at 5 minutes, so we were preparing to give BBO2 when his saturations suddenly came up into the 90s in room air. We observed him for 2-3 more minutes, and he remained pink, without distress. Ap 2/7/9.  I spoke with the parents in the DR, then transferred the baby to the Pediatrician's care.   Real Cons, MD

## 2015-07-22 NOTE — Anesthesia Procedure Notes (Signed)
Epidural Patient location during procedure: OB Start time: 07/22/2015 8:53 AM  Staffing Anesthesiologist: Josephine Igo Performed by: anesthesiologist   Preanesthetic Checklist Completed: patient identified, site marked, surgical consent, pre-op evaluation, timeout performed, IV checked, risks and benefits discussed and monitors and equipment checked  Epidural Patient position: sitting Prep: site prepped and draped and DuraPrep Patient monitoring: continuous pulse ox and blood pressure Approach: midline Location: L3-L4 Injection technique: LOR air  Needle:  Needle type: Tuohy  Needle gauge: 17 G Needle length: 9 cm and 9 Needle insertion depth: 4 cm Catheter type: closed end flexible Catheter size: 19 Gauge Catheter at skin depth: 9 cm Test dose: negative and Other  Assessment Events: blood not aspirated, injection not painful, no injection resistance, negative IV test and no paresthesia  Additional Notes Patient identified. Risks and benefits discussed including failed block, incomplete  Pain control, post dural puncture headache, nerve damage, paralysis, blood pressure Changes, nausea, vomiting, reactions to medications-both toxic and allergic and post Partum back pain. All questions were answered. Patient expressed understanding and wished to proceed. Sterile technique was used throughout procedure. Epidural site was Dressed with sterile barrier dressing. No paresthesias, signs of intravascular injection Or signs of intrathecal spread were encountered.  Patient was more comfortable after the epidural was dosed. Please see RN's note for documentation of vital signs and FHR which are stable.

## 2015-07-23 LAB — CBC
HCT: 23.1 % — ABNORMAL LOW (ref 36.0–46.0)
Hemoglobin: 8.1 g/dL — ABNORMAL LOW (ref 12.0–15.0)
MCH: 32 pg (ref 26.0–34.0)
MCHC: 35.1 g/dL (ref 30.0–36.0)
MCV: 91.3 fL (ref 78.0–100.0)
Platelets: 134 10*3/uL — ABNORMAL LOW (ref 150–400)
RBC: 2.53 MIL/uL — ABNORMAL LOW (ref 3.87–5.11)
RDW: 14.1 % (ref 11.5–15.5)
WBC: 12.8 10*3/uL — ABNORMAL HIGH (ref 4.0–10.5)

## 2015-07-23 MED ORDER — POLYSACCHARIDE IRON COMPLEX 150 MG PO CAPS
150.0000 mg | ORAL_CAPSULE | Freq: Two times a day (BID) | ORAL | Status: DC
  Administered 2015-07-23 – 2015-07-24 (×2): 150 mg via ORAL
  Filled 2015-07-23 (×2): qty 1

## 2015-07-23 NOTE — Lactation Note (Addendum)
This note was copied from the chart of Bruce. Lactation Consultation Note: [name] latched on in football hold. Infant sustained latch for 20-25 mins. Assist mother with  Hand expression and infant was given 1.5 ml colostrum with a curved tip syringe at the breast . Mother taught breast compression. Only adjusted lower jaw for wider gape.infants top lip flanges well. Infant sustained latch for 25 mins with needed stimulation .   1635: [name] Griffin placed skin to skin and was  given 1 ml of colostrum at breast.  infant latched on for a few sucks and bounces off. Multiple attempts to latch. Infant has hiccups and is a little spitty. After 10 mins of skin to skin infant latched with good deep latch and observed swallows.  Parents were advised to begin supplementing infant with EBM after each feeding. Discussed the use of formula until milk comes to volume, and if infants are not latching well. Parents were given supplemental guidelines.  Parents with decide.   I sat up DEBP  and staff nurse to assist with post pumping for 59mins on premie setting. Parents receptive to all teaching .   Patient Name: Felicia Cole [id number] Date: 07/23/2015 Reason for consult: Follow-up assessment   Maternal Data    Feeding Feeding Type: Breast Fed  LATCH Score/Interventions Latch: Repeated attempts needed to sustain latch, nipple held in mouth throughout feeding, stimulation needed to elicit sucking reflex. Intervention(s): Skin to skin  Audible Swallowing: A few with stimulation  Type of Nipple: Everted at rest and after stimulation  Comfort (Breast/Nipple): Soft / non-tender     Hold (Positioning): Assistance needed to correctly position infant at breast and maintain latch. Intervention(s): Support Pillows;Position options;Skin to skin  LATCH Score: 7  Lactation Tools Discussed/Used     Consult Status Consult Status: Follow-up Date: 07/23/15 Follow-up type:  In-patient    Jess Barters [facility] 07/23/2015, 4:22 PM

## 2015-07-23 NOTE — Progress Notes (Signed)
PPD #1- SVD  Subjective:   Reports feeling well No SOB or CP Some dizziness earlier with ambulation, but getting better Tolerating po/ No nausea or vomiting Bleeding is light Pain controlled with Motrin Up ad lib / ambulatory / voiding without problems Newborn: breastfeeding  / Circumcision: planning   Objective:   VS:  VS:  Filed Vitals:   07/23/15 0130 07/23/15 0416 07/23/15 0900 07/23/15 0946  BP: 99/70 103/63  102/72  Pulse: 108 97  79  Temp: 98.2 F (36.8 C) 97.9 F (36.6 C)  97.7 F (36.5 C)  TempSrc: Oral Axillary  Oral  Resp: 18 18  20   Height:      Weight:   68.04 kg (150 lb)   SpO2: 100% 99%      LABS:  Recent Labs  07/22/15 1640 07/23/15 0538  WBC 17.9* 12.8*  HGB 10.5* 8.1*  PLT 142* 134*   Blood type: --/--/A POS, A POS (08/22 0120) Rubella: Immune (02/17 0000)   I&O: Intake/Output      08/22 0701 - 08/23 0700 08/23 0701 - 08/24 0700   P.O. 440 240   Total Intake(mL/kg) 440 (5.7) 240 (3.5)   Urine (mL/kg/hr) 300 (0.2)    Blood 1200 (0.6)    Total Output 1500     Net -1060 +240        Urine Occurrence 1 x      Physical Exam: Alert and oriented x3 Heart: RRR Lungs: CTA bilat Abdomen: soft, non-tender, non-distended  Fundus: firm, non-tender, U-2 Perineum: Well approximated, no significant erythema, or drainage; 3+ labial and perirectal edema; ice pk in place; healing well. Lochia: small Extremities: Trace BLE edema, no calf pain or tenderness    Assessment:  PPD #1 G2P2003/ S/P:spontaneous vaginal, twins, abruption twin B, 2nd degree laceration  ABL anemia Doing well    Plan: Strict I/O until discharge D/C HCTZ Ice pks consistently, sitz bath after 24 hrs Continue routine post partum orders Anticipate D/C home tomorrow   Julianne Handler, N MSN, CNM 07/23/2015, 11:26 AM

## 2015-07-23 NOTE — Anesthesia Postprocedure Evaluation (Signed)
  Anesthesia Post-op Note  Patient: Felicia Cole  Procedure(s) Performed: * No procedures listed *  Patient Location: Mother/Baby  Anesthesia Type:Epidural  Level of Consciousness: awake, alert  and oriented  Airway and Oxygen Therapy: Patient Spontanous Breathing  Post-op Pain: none  Post-op Assessment: Post-op Vital signs reviewed and Patient's Cardiovascular Status Stable              Post-op Vital Signs: Reviewed and stable  Last Vitals:  Filed Vitals:   07/23/15 0946  BP: 102/72  Pulse: 79  Temp: 36.5 C  Resp: 20    Complications: No apparent anesthesia complications

## 2015-07-24 ENCOUNTER — Ambulatory Visit: Payer: Self-pay

## 2015-07-24 MED ORDER — OXYCODONE-ACETAMINOPHEN 5-325 MG PO TABS: 1.0000 | ORAL_TABLET | ORAL | Status: DC | PRN

## 2015-07-24 MED ORDER — IBUPROFEN 600 MG PO TABS: 600.0000 mg | ORAL_TABLET | Freq: Four times a day (QID) | ORAL | Status: DC

## 2015-07-24 MED ORDER — POLYSACCHARIDE IRON COMPLEX 150 MG PO CAPS: 150.0000 mg | ORAL_CAPSULE | Freq: Two times a day (BID) | ORAL | Status: DC

## 2015-07-24 NOTE — Progress Notes (Signed)
PPD #2 - SVD  Subjective:   Reports feeling well No SOB or CP No dizziness today - feeling better Tolerating po/ No nausea or vomiting Bleeding is light Pain controlled with Motrin Up ad lib / ambulatory / voiding without problems Newborn: breastfeeding  / Circumcision: done x 2   Objective:   VS:  VS:  Filed Vitals:   07/23/15 0900 07/23/15 0946 07/23/15 1814 07/24/15 0530  BP:  102/72 105/65 100/69  Pulse:  79 94 83  Temp:  97.7 F (36.5 C) 97.5 F (36.4 C) 98 F (36.7 C)  TempSrc:  Oral Axillary Oral  Resp:  20 20 18   Height:      Weight: 68.04 kg (150 lb)     SpO2:   99%     LABS:   Recent Labs  07/22/15 1640 07/23/15 0538  WBC 17.9* 12.8*  HGB 10.5* 8.1*  PLT 142* 134*   Blood type: --/--/A POS, A POS (08/22 0120) Rubella: Immune (02/17 0000)   I&O: Intake/Output      08/23 0701 - 08/24 0700 08/24 0701 - 08/25 0700   P.O. 600    Total Intake(mL/kg) 600 (8.8)    Urine (mL/kg/hr) 1210 (0.7)    Stool 0 (0)    Blood     Total Output 1210     Net -610          Stool Occurrence 1 x      Physical Exam: Alert and oriented x3 Heart: RRR Lungs: CTA bilat Abdomen: soft, non-tender, non-distended  Fundus: firm, non-tender, U-2 Perineum: Well approximated, no significant erythema, or drainage; 3+ labial and perirectal edema; ice pk in place; healing well. Lochia: small Extremities: Trace BLE edema, no calf pain or tenderness    Assessment:  PPD #2 / [id number] / S/P:spontaneous vaginal, twins, abruption twin B, 2nd degree laceration  ABL anemia Doing well    Plan: Strict I/O until discharge Continue routine post partum orders D/C home today   Laury Deep, M MSN, CNM 07/24/2015, 9:01 AM

## 2015-07-24 NOTE — Discharge Instructions (Signed)
Breast Pumping Tips °If you are breastfeeding, there may be times when you cannot feed your baby directly. Returning to work or going on a trip are common examples. Pumping allows you to store breast milk and feed it to your baby later.  °You may not get much milk when you first start to pump. Your breasts should start to make more after a few days. If you pump at the times you usually feed your baby, you may be able to keep making enough milk to feed your baby without also using formula. The more often you pump, the more milk you will produce.  °WHEN SHOULD I PUMP?  °· You can begin to pump soon after delivery. However, some experts recommend waiting about 4 weeks before giving your infant a bottle to make sure breastfeeding is going well.  °· If you plan to return to work, begin pumping a few weeks before. This will help you develop techniques that work best for you. It also lets you build up a supply of breast milk.   °· When you are with your infant, feed on demand and pump after each feeding.   °· When you are away from your infant for several hours, pump for about 15 minutes every 2-3 hours. Pump both breasts at the same time if you can.   °· If your infant has a formula feeding, make sure to pump around the same time.     °· If you drink any alcohol, wait 2 hours before pumping.   °HOW DO I PREPARE TO PUMP? °Your let-down reflex is the natural reaction to stimulation that makes your breast milk flow. It is easier to stimulate this reflex when you are relaxed. Find relaxation techniques that work for you. If you have difficulty with your let-down reflex, try these methods:  °· Smell one of your infant's blankets or an item of clothing.   °· Look at a picture or video of your infant.   °· Sit in a quiet, private space.   °· Massage the breast you plan to pump.   °· Place soothing warmth on the breast.   °· Play relaxing music.   °WHAT ARE SOME GENERAL BREAST PUMPING TIPS? °· Wash your hands before you pump. You  do not need to wash your nipples or breasts. °· There are three ways to pump. °· You can use your hand to massage and compress your breast. °· You can use a handheld manual pump. °· You can use an electric pump.   °· Make sure the suction cup (flange) on the breast pump is the right size. Place the flange directly over the nipple. If it is the wrong size or placed the wrong way, it may be painful and cause nipple damage.   °· If pumping is uncomfortable, apply a small amount of purified or modified lanolin to your nipple and areola. °· If you are using an electric pump, adjust the speed and suction power to be more comfortable. °· If pumping is painful or if you are not getting very much milk, you may need a different type of pump. A lactation consultant can help you determine what type of pump to use.   °· Keep a full water bottle near you at all times. Drinking lots of fluid helps you make more milk.  °· You can store your milk to use later. Pumped breast milk can be stored in a sealable, sterile container or plastic bag. Label all stored breast milk with the date you pumped it. °· Milk can stay out at room temperature for up to 8 hours. °·   You can store your milk in the refrigerator for up to 8 days.  You can store your milk in the freezer for 3 months. Thaw frozen milk using warm water. Do not put it in the microwave.  Do not smoke. Smoking can lower your milk supply and harm your infant. If you need help quitting, ask your health care provider to recommend a program.  [place] A LACTATION CONSULTANT?  You are having trouble pumping.  You are concerned that you are not making enough milk.  You have nipple pain, soreness, or redness.  You want to use birth control. Birth control pills may lower your milk supply. Talk to your health care provider about your options. Document Released: 05/06/2010 Document Revised: 11/21/2013 Document Reviewed:  09/08/2013 [facility] Patient Information 2015 [place], [place]. This information is not intended to replace advice given to you by your health care provider. Make sure you discuss any questions you have with your health care provider.  Nutrition for the New Mother  A new mother needs good health and nutrition so she can have energy to take care of a new baby. Whether a mother breastfeeds or formula feeds the baby, it is important to have a well-balanced diet. Foods from all the food groups should be chosen to meet the new mother's energy needs and to give her the nutrients needed for repair and healing.  A HEALTHY EATING PLAN The My Pyramid plan for Moms outlines what you should eat to help you and your baby stay healthy. The energy and amount of food you need depends on whether or not you are breastfeeding. If you are breastfeeding you will need more nutrients. If you choose not to breastfeed, your nutrition goal should be to return to a healthy weight. Limiting calories may be needed if you are not breastfeeding.  HOME CARE INSTRUCTIONS   For a personal plan based on your unique needs, see your Registered Dietitian or visit [email or web address]  Eat a variety of foods. The plan below will help guide you. The following chart has a suggested daily meal plan from the My Pyramid for Moms.  Eat a variety of fruits and vegetables.  Eat more dark green and orange vegetables and cooked dried beans.  Make half your grains whole grains. Choose whole instead of refined grains.  Choose low-fat or lean meats and poultry.  Choose low-fat or fat-free dairy products like milk, cheese, or yogurt. Fruits  Breastfeeding: 2 cups  Non-Breastfeeding: 2 cups  What Counts as a serving?  1 cup of fruit or juice.   cup dried fruit. Vegetables  Breastfeeding: 3 cups  Non-Breastfeeding: 2  cups  What Counts as a serving?  1 cup raw or cooked vegetables.  Juice or 2 cups raw leafy  vegetables. Grains  Breastfeeding: 8 oz  Non-Breastfeeding: 6 oz  What Counts as a serving?  1 slice bread.  1 oz ready-to-eat cereal.   cup cooked pasta, rice, or cereal. Meat and Beans  Breastfeeding: 6  oz  Non-Breastfeeding: 5  oz  What Counts as a serving?  1 oz lean meat, poultry, or fish   cup cooked dry beans   oz nuts or 1 egg  1 tbs peanut butter Milk  Breastfeeding: 3 cups  Non-Breastfeeding: 3 cups  What Counts as a serving?  1 cup milk.  8 oz yogurt.  1  oz cheese.  2 oz processed cheese. TIPS FOR THE BREASTFEEDING MOM  Rapid weight  loss is not suggested when you are breastfeeding. By simply breastfeeding, you will be able to lose the weight gained during your pregnancy. Your caregiver can keep track of your weight and tell you if your weight loss is appropriate.  Be sure to drink fluids. You may notice that you are thirstier than usual. A suggestion is to drink a glass of water or other beverage whenever you breastfeed.  Avoid alcohol as it can be passed into your breast milk.  Limit caffeine drinks to no more than 2 to 3 cups per day.  You may need to keep taking your prenatal vitamin while you are breastfeeding. Talk with your caregiver about taking a vitamin or supplement. RETURING TO A HEALTHY WEIGHT  The My Pyramid Plan for Moms will help you return to a healthy weight. It will also provide the nutrients you need.  You may need to limit "empty" calories. These include:  High fat foods like fried foods, fatty meats, fast food, butter, and mayonnaise.  High sugar foods like sodas, jelly, candy, and sweets.  Be physically active. Include 30 minutes of exercise or more each day. Choose an activity you like such as walking, swimming, biking, or aerobics. Check with your caregiver before you start to exercise. Document Released: 02/23/2008 Document Revised: 02/08/2012 Document Reviewed: 02/23/2008 [facility] Patient Information  2015 [place], [place]. This information is not intended to replace advice given to you by your health care provider. Make sure you discuss any questions you have with your health care provider. Postpartum Depression and Baby Blues The postpartum period begins right after the birth of a baby. During this time, there is often a great amount of joy and excitement. It is also a time of many changes in the life of the parents. Regardless of how many times a mother gives birth, each child brings new challenges and dynamics to the family. It is not unusual to have feelings of excitement along with confusing shifts in moods, emotions, and thoughts. All mothers are at risk of developing postpartum depression or the "baby blues." These mood changes can occur right after giving birth, or they may occur many months after giving birth. The baby blues or postpartum depression can be mild or severe. Additionally, postpartum depression can go away rather quickly, or it can be a long-term condition.  CAUSES Raised hormone levels and the rapid drop in those levels are thought to be a main cause of postpartum depression and the baby blues. A number of hormones change during and after pregnancy. Estrogen and progesterone usually decrease right after the delivery of your baby. The levels of thyroid hormone and various cortisol steroids also rapidly drop. Other factors that play a role in these mood changes include major life events and genetics.  RISK FACTORS If you have any of the following risks for the baby blues or postpartum depression, know what symptoms to watch out for during the postpartum period. Risk factors that may increase the likelihood of getting the baby blues or postpartum depression include:  Having a personal or family history of depression.   Having depression while being pregnant.   Having premenstrual mood issues or mood issues related to oral contraceptives.  Having a lot of life stress.   Having  marital conflict.   Lacking a social support network.   Having a baby with special needs.   Having health problems, such as diabetes.  SIGNS AND SYMPTOMS Symptoms of baby blues include:  Brief changes in mood, such as going  from extreme happiness to sadness.  Decreased concentration.   Difficulty sleeping.   Crying spells, tearfulness.   Irritability.   Anxiety.  Symptoms of postpartum depression typically begin within the first month after giving birth. These symptoms include:  Difficulty sleeping or excessive sleepiness.   Marked weight loss.   Agitation.   Feelings of worthlessness.   Lack of interest in activity or food.  Postpartum psychosis is a very serious condition and can be dangerous. Fortunately, it is rare. Displaying any of the following symptoms is cause for immediate medical attention. Symptoms of postpartum psychosis include:   Hallucinations and delusions.   Bizarre or disorganized behavior.   Confusion or disorientation.  DIAGNOSIS  A diagnosis is made by an evaluation of your symptoms. There are no medical or lab tests that lead to a diagnosis, but there are various questionnaires that a health care provider may use to identify those with the baby blues, postpartum depression, or psychosis. Often, a screening tool called the [place] Postnatal Depression Scale is used to diagnose depression in the postpartum period.  TREATMENT The baby blues usually goes away on its own in 1-2 weeks. Social support is often all that is needed. You will be encouraged to get adequate sleep and rest. Occasionally, you may be given medicines to help you sleep.  Postpartum depression requires treatment because it can last several months or longer if it is not treated. Treatment may include individual or group therapy, medicine, or both to address any social, physiological, and psychological factors that may play a role in the depression. Regular exercise, a  healthy diet, rest, and social support may also be strongly recommended.  Postpartum psychosis is more serious and needs treatment right away. Hospitalization is often needed. HOME CARE INSTRUCTIONS  Get as much rest as you can. Nap when the baby sleeps.   Exercise regularly. Some women find yoga and walking to be beneficial.   Eat a balanced and nourishing diet.   Do little things that you enjoy. Have a cup of tea, take a bubble bath, read your favorite magazine, or listen to your favorite music.  Avoid alcohol.   Ask for help with household chores, cooking, grocery shopping, or running errands as needed. Do not try to do everything.   Talk to people close to you about how you are feeling. Get support from your partner, family members, friends, or other new moms.  Try to stay positive in how you think. Think about the things you are grateful for.   Do not spend a lot of time alone.   Only take over-the-counter or prescription medicine as directed by your health care provider.  Keep all your postpartum appointments.   Let your health care provider know if you have any concerns.  SEEK MEDICAL CARE IF: You are having a reaction to or problems with your medicine. SEEK IMMEDIATE MEDICAL CARE IF:  You have suicidal feelings.   You think you may harm the baby or someone else. MAKE SURE YOU:  Understand these instructions.  Will watch your condition.  Will get help right away if you are not doing well or get worse. Document Released: 08/20/2004 Document Revised: 11/21/2013 Document Reviewed: 08/28/2013 [facility] Patient Information 2015 [place], [place]. This information is not intended to replace advice given to you by your health care provider. Make sure you discuss any questions you have with your health care provider. Breastfeeding Twins or Multiples Breastfeeding benefits you and your babies. Breastfeeding:  Causes the uterus to  contract and return to its original  size faster.  Releases hormones that relax you.  Saves money and time. Your milk is available whenever your babies are ready to feed, and it is at the right temperature.  Ensures your babies get the best nutrition. Breast milk is especially beneficial to multiples, who are often small at birth and need all the advantages breast milk can provide.  Creates a unique bond between you and each of your babies. HOW DO I GET STARTED? Nurse as soon as possible after delivery and as often as your babies want to be nursed. This will stimulate your breasts to produce enough milk. Mothers of multiples almost always produce enough milk for all their babies.  If your babies are premature and unable to nurse, you can pump your breasts and freeze the milk until your babies are ready to feed at the breast. To stimulate a milk supply, your breasts need to be emptied at least 8-10 times in a 24-hour period. Ask a lactation specialist to help you choose an effective breast pump and to provide guidance in helping your babies latch on to and feed from the breast when they are ready.  SHOULD I NURSE MY BABIES TOGETHER? Many mothers of multiples find it easiest to nurse two babies at the same time. Nursing babies together may also increase milk-producing hormone (prolactin) levels. The more often the babies nurse effectively, the more milk you will produce. When nursing your babies together:  Ask your nurse or lactation specialist to suggest tips on positioning. There are several positions and holds that make it easier to nurse more than one baby at a time.  Try placing pillows under your arms, legs, and the babies for comfort. Nursing two babies at the same time often gets easier as the babies get older and more experienced at latching on to the breast.  Switch the babies from one side to the other at alternate feedings. For example, if [name] feeds from the right breast and baby B feeds from the left breast, then at  the next feeding [name] should take the left breast and baby B the right breast. This ensures that both breasts get equal amounts of stimulation. It also allows the stronger sucking baby to increase the milk supply for the baby whose suck is weaker. WHAT ARE SOME TIPS TO INCREASE MY SUCCESS?  If you are nursing babies together and one of the babies is having difficulty latching or sucking, try nursing that baby separately so you can give him or her your full attention.  If you are nursing babies separately and one of the babies is having difficulty feeding, it may help to nurse that baby at the same time as his or her sibling. The baby with the stronger or more effective suck will stimulate the mother's milk to flow faster. This will encourage the baby who is having difficulty to suck and swallow correctly.   Try not to give your babies bottles and pacifiers during the early weeks of breastfeeding. Avoiding these things encourages effective sucking patterns and helps establish a good milk supply. You should not need supplemental feedings if you empty your breasts with each feeding.   A good latch for both infants is important in helping the babies empty the breast effectively and for avoiding sore nipples. The most common cause of sore nipples is an improper latch.   Keep track of each baby's stools and wet diapers for the first 6 weeks to  make sure each baby is getting enough milk. Each baby should have 6-8 wet diapers and 2 or more bowel movements per day.  Document Released: 03/16/2005 Document Revised: 11/21/2013 Document Reviewed: 08/28/2013 [facility] Patient Information 2015 [place], [place]. This information is not intended to replace advice given to you by your health care provider. Make sure you discuss any questions you have with your health care provider. Postpartum Care After Vaginal Delivery After you deliver your newborn (postpartum period), the usual stay in the hospital is 24-72  hours. If there were problems with your labor or delivery, or if you have other medical problems, you might be in the hospital longer.  While you are in the hospital, you will receive help and instructions on how to care for yourself and your newborn during the postpartum period.  While you are in the hospital:  Be sure to tell your nurses if you have pain or discomfort, as well as where you feel the pain and what makes the pain worse.  If you had an incision made near your vagina (episiotomy) or if you had some tearing during delivery, the nurses may put ice packs on your episiotomy or tear. The ice packs may help to reduce the pain and swelling.  If you are breastfeeding, you may feel uncomfortable contractions of your uterus for a couple of weeks. This is normal. The contractions help your uterus get back to normal size.  It is normal to have some bleeding after delivery.  For the first 1-3 days after delivery, the flow is red and the amount may be similar to a period.  It is common for the flow to start and stop.  In the first few days, you may pass some small clots. Let your nurses know if you begin to pass large clots or your flow increases.  Do not  flush blood clots down the toilet before having the nurse look at them.  During the next 3-10 days after delivery, your flow should become more watery and pink or brown-tinged in color.  Ten to fourteen days after delivery, your flow should be a small amount of yellowish-white discharge.  The amount of your flow will decrease over the first few weeks after delivery. Your flow may stop in 6-8 weeks. Most women have had their flow stop by 12 weeks after delivery.  You should change your sanitary pads frequently.  Wash your hands thoroughly with soap and water for at least 20 seconds after changing pads, using the toilet, or before holding or feeding your newborn.  You should feel like you need to empty your bladder within the first 6-8  hours after delivery.  In case you become weak, lightheaded, or faint, call your nurse before you get out of bed for the first time and before you take a shower for the first time.  Within the first few days after delivery, your breasts may begin to feel tender and full. This is called engorgement. Breast tenderness usually goes away within 48-72 hours after engorgement occurs. You may also notice milk leaking from your breasts. If you are not breastfeeding, do not stimulate your breasts. Breast stimulation can make your breasts produce more milk.  Spending as much time as possible with your newborn is very important. During this time, you and your newborn can feel close and get to know each other. Having your newborn stay in your room (rooming in) will help to strengthen the bond with your newborn. It will give you time  to get to know your newborn and become comfortable caring for your newborn.  Your hormones change after delivery. Sometimes the hormone changes can temporarily cause you to feel sad or tearful. These feelings should not last more than a few days. If these feelings last longer than that, you should talk to your caregiver.  If desired, talk to your caregiver about methods of family planning or contraception.  Talk to your caregiver about immunizations. Your caregiver may want you to have the following immunizations before leaving the hospital:  Tetanus, diphtheria, and pertussis (Tdap) or tetanus and diphtheria (Td) immunization. It is very important that you and your family (including grandparents) or others caring for your newborn are up-to-date with the Tdap or Td immunizations. The Tdap or Td immunization can help protect your newborn from getting ill.  Rubella immunization.  Varicella (chickenpox) immunization.  Influenza immunization. You should receive this annual immunization if you did not receive the immunization during your pregnancy. Document Released: 09/13/2007  Document Revised: 08/10/2012 Document Reviewed: 07/13/2012 [facility] Patient Information 2015 [place], [place]. This information is not intended to replace advice given to you by your health care provider. Make sure you discuss any questions you have with your health care provider. Breastfeeding and Mastitis Mastitis is inflammation of the breast tissue. It can occur in women who are breastfeeding. This can make breastfeeding painful. Mastitis will sometimes go away on its own. Your health care provider will help determine if treatment is needed. CAUSES Mastitis is often associated with a blocked milk (lactiferous) duct. This can happen when too much milk builds up in the breast. Causes of excess milk in the breast can include:  Poor latch-on. If your baby is not latched onto the breast properly, she or he may not empty your breast completely while breastfeeding.  Allowing too much time to pass between feedings.  Wearing a bra or other clothing that is too tight. This puts extra pressure on the lactiferous ducts so milk does not flow through them as it should. Mastitis can also be caused by a bacterial infection. Bacteria may enter the breast tissue through cuts or openings in the skin. In women who are breastfeeding, this may occur because of cracked or irritated skin. Cracks in the skin are often caused when your baby does not latch on properly to the breast. SIGNS AND SYMPTOMS  Swelling, redness, tenderness, and pain in an area of the breast.  Swelling of the glands under the arm on the same side.  Fever may or may not accompany mastitis. If an infection is allowed to progress, a collection of pus (abscess) may develop. DIAGNOSIS  Your health care provider can usually diagnose mastitis based on your symptoms and a physical exam. Tests may be done to help confirm the diagnosis. These may include:  Removal of pus from the breast by applying pressure to the area. This pus can be examined in the  lab to determine which bacteria are present. If an abscess has developed, the fluid in the abscess can be removed with a needle. This can also be used to confirm the diagnosis and determine the bacteria present. In most cases, pus will not be present.  Blood tests to determine if your body is fighting a bacterial infection.  Mammogram or ultrasound tests to rule out other problems or diseases. TREATMENT  Mastitis that occurs with breastfeeding will sometimes go away on its own. Your health care provider may choose to wait 24 hours after first seeing you to  decide whether a prescription medicine is needed. If your symptoms are worse after 24 hours, your health care provider will likely prescribe an antibiotic medicine to treat the mastitis. He or she will determine which bacteria are most likely causing the infection and will then select an appropriate antibiotic medicine. This is sometimes changed based on the results of tests performed to identify the bacteria, or if there is no response to the antibiotic medicine selected. Antibiotic medicines are usually given by mouth. You may also be given medicine for pain. HOME CARE INSTRUCTIONS  Only take over-the-counter or prescription medicines for pain, fever, or discomfort as directed by your health care provider.  If your health care provider prescribed an antibiotic medicine, take the medicine as directed. Make sure you finish it even if you start to feel better.  Do not wear a tight or underwire bra. Wear a soft, supportive bra.  Increase your fluid intake, especially if you have a fever.  Continue to empty the breast. Your health care provider can tell you whether this milk is safe for your infant or needs to be thrown out. You may be told to stop nursing until your health care provider thinks it is safe for your baby. Use a breast pump if you are advised to stop nursing.  Keep your nipples clean and dry.  Empty the first breast completely  before going to the other breast. If your baby is not emptying your breasts completely for some reason, use a breast pump to empty your breasts.  If you go back to work, pump your breasts while at work to stay in time with your nursing schedule.  Avoid allowing your breasts to become overly filled with milk (engorged). SEEK MEDICAL CARE IF:  You have pus-like discharge from the breast.  Your symptoms do not improve with the treatment prescribed by your health care provider within 2 days. SEEK IMMEDIATE MEDICAL CARE IF:  Your pain and swelling are getting worse.  You have pain that is not controlled with medicine.  You have a red line extending from the breast toward your armpit.  You have a fever or persistent symptoms for more than 2-3 days.  You have a fever and your symptoms suddenly get worse. MAKE SURE YOU:   Understand these instructions.  Will watch your condition.  Will get help right away if you are not doing well or get worse. Document Released: 03/13/2005 Document Revised: 11/21/2013 Document Reviewed: 06/22/2013 [facility] Patient Information 2015 [place], [place]. This information is not intended to replace advice given to you by your health care provider. Make sure you discuss any questions you have with your health care provider. Breastfeeding Deciding to breastfeed is one of the best choices you can make for you and your baby. A change in hormones during pregnancy causes your breast tissue to grow and increases the number and size of your milk ducts. These hormones also allow proteins, sugars, and fats from your blood supply to make breast milk in your milk-producing glands. Hormones prevent breast milk from being released before your baby is born as well as prompt milk flow after birth. Once breastfeeding has begun, thoughts of your baby, as well as his or her sucking or crying, can stimulate the release of milk from your milk-producing glands.  BENEFITS OF  BREASTFEEDING For Your Baby  Your first milk (colostrum) helps your baby's digestive system function better.   There are antibodies in your milk that help your baby fight off infections.  Your baby has a lower incidence of asthma, allergies, and sudden infant death syndrome.   The nutrients in breast milk are better for your baby than infant formulas and are designed uniquely for your baby's needs.   Breast milk improves your baby's brain development.   Your baby is less likely to develop other conditions, such as childhood obesity, asthma, or type 2 diabetes mellitus.  For You   Breastfeeding helps to create a very special bond between you and your baby.   Breastfeeding is convenient. Breast milk is always available at the correct temperature and costs nothing.   Breastfeeding helps to burn calories and helps you lose the weight gained during pregnancy.   Breastfeeding makes your uterus contract to its prepregnancy size faster and slows bleeding (lochia) after you give birth.   Breastfeeding helps to lower your risk of developing type 2 diabetes mellitus, osteoporosis, and breast or ovarian cancer later in life. SIGNS THAT YOUR BABY IS HUNGRY Early Signs of Hunger  Increased alertness or activity.  Stretching.  Movement of the head from side to side.  Movement of the head and opening of the mouth when the corner of the mouth or cheek is stroked (rooting).  Increased sucking sounds, smacking lips, cooing, sighing, or squeaking.  Hand-to-mouth movements.  Increased sucking of fingers or hands. Late Signs of Hunger  Fussing.  Intermittent crying. Extreme Signs of Hunger Signs of extreme hunger will require calming and consoling before your baby will be able to breastfeed successfully. Do not wait for the following signs of extreme hunger to occur before you initiate breastfeeding:   Restlessness.  A loud, strong cry.   Screaming. BREASTFEEDING  BASICS Breastfeeding Initiation  Find a comfortable place to sit or lie down, with your neck and back well supported.  Place a pillow or rolled up blanket under your baby to bring him or her to the level of your breast (if you are seated). Nursing pillows are specially designed to help support your arms and your baby while you breastfeed.  Make sure that your baby's abdomen is facing your abdomen.   Gently massage your breast. With your fingertips, massage from your chest wall toward your nipple in a circular motion. This encourages milk flow. You may need to continue this action during the feeding if your milk flows slowly.  Support your breast with 4 fingers underneath and your thumb above your nipple. Make sure your fingers are well away from your nipple and your baby's mouth.   Stroke your baby's lips gently with your finger or nipple.   When your baby's mouth is open wide enough, quickly bring your baby to your breast, placing your entire nipple and as much of the colored area around your nipple (areola) as possible into your baby's mouth.   More areola should be visible above your baby's upper lip than below the lower lip.   Your baby's tongue should be between his or her lower gum and your breast.   Ensure that your baby's mouth is correctly positioned around your nipple (latched). Your baby's lips should create a seal on your breast and be turned out (everted).  It is common for your baby to suck about 2-3 minutes in order to start the flow of breast milk. Latching Teaching your baby how to latch on to your breast properly is very important. An improper latch can cause nipple pain and decreased milk supply for you and poor weight gain in your baby. Also, if your  baby is not latched onto your nipple properly, he or she may swallow some air during feeding. This can make your baby fussy. Burping your baby when you switch breasts during the feeding can help to get rid of the air.  However, teaching your baby to latch on properly is still the best way to prevent fussiness from swallowing air while breastfeeding. Signs that your baby has successfully latched on to your nipple:    Silent tugging or silent sucking, without causing you pain.   Swallowing heard between every 3-4 sucks.    Muscle movement above and in front of his or her ears while sucking.  Signs that your baby has not successfully latched on to nipple:   Sucking sounds or smacking sounds from your baby while breastfeeding.  Nipple pain. If you think your baby has not latched on correctly, slip your finger into the corner of your baby's mouth to break the suction and place it between your baby's gums. Attempt breastfeeding initiation again. Signs of Successful Breastfeeding Signs from your baby:   A gradual decrease in the number of sucks or complete cessation of sucking.   Falling asleep.   Relaxation of his or her body.   Retention of a small amount of milk in his or her mouth.   Letting go of your breast by himself or herself. Signs from you:  Breasts that have increased in firmness, weight, and size 1-3 hours after feeding.   Breasts that are softer immediately after breastfeeding.  Increased milk volume, as well as a change in milk consistency and color by the fifth day of breastfeeding.   Nipples that are not sore, cracked, or bleeding. Signs That Your [employer] is Getting Enough Milk  Wetting at least 3 diapers in a 24-hour period. The urine should be clear and pale yellow by age 18 days.  At least 3 stools in a 24-hour period by age 18 days. The stool should be soft and yellow.  At least 3 stools in a 24-hour period by age 9 days. The stool should be seedy and yellow.  No loss of weight greater than 10% of birth weight during the first 71 days of age.  Average weight gain of 4-7 ounces (113-198 g) per week after age 97 days.  Consistent daily weight gain by age 65 days,  without weight loss after the age of 2 weeks. After a feeding, your baby may spit up a small amount. This is common. BREASTFEEDING FREQUENCY AND DURATION Frequent feeding will help you make more milk and can prevent sore nipples and breast engorgement. Breastfeed when you feel the need to reduce the fullness of your breasts or when your baby shows signs of hunger. This is called "breastfeeding on demand." Avoid introducing a pacifier to your baby while you are working to establish breastfeeding (the first 4-6 weeks after your baby is born). After this time you may choose to use a pacifier. Research has shown that pacifier use during the first year of a baby's life decreases the risk of sudden infant death syndrome (SIDS). Allow your baby to feed on each breast as long as he or she wants. Breastfeed until your baby is finished feeding. When your baby unlatches or falls asleep while feeding from the first breast, offer the second breast. Because newborns are often sleepy in the first few weeks of life, you may need to awaken your baby to get him or her to feed. Breastfeeding times will vary from baby to baby.  However, the following rules can serve as a guide to help you ensure that your baby is properly fed:  Newborns (babies 34 weeks of age or younger) may breastfeed every 1-3 hours.  Newborns should not go longer than 3 hours during the day or 5 hours during the night without breastfeeding.  You should breastfeed your baby a minimum of 8 times in a 24-hour period until you begin to introduce solid foods to your baby at around 49 months of age. BREAST MILK PUMPING Pumping and storing breast milk allows you to ensure that your baby is exclusively fed your breast milk, even at times when you are unable to breastfeed. This is especially important if you are going back to work while you are still breastfeeding or when you are not able to be present during feedings. Your lactation consultant can give you  guidelines on how long it is safe to store breast milk.  A breast pump is a machine that allows you to pump milk from your breast into a sterile bottle. The pumped breast milk can then be stored in a refrigerator or freezer. Some breast pumps are operated by hand, while others use electricity. Ask your lactation consultant which type will work best for you. Breast pumps can be purchased, but some hospitals and breastfeeding support groups lease breast pumps on a monthly basis. A lactation consultant can teach you how to hand express breast milk, if you prefer not to use a pump.  CARING FOR YOUR BREASTS WHILE YOU BREASTFEED Nipples can become dry, cracked, and sore while breastfeeding. The following recommendations can help keep your breasts moisturized and healthy:  Avoid using soap on your nipples.   Wear a supportive bra. Although not required, special nursing bras and tank tops are designed to allow access to your breasts for breastfeeding without taking off your entire bra or top. Avoid wearing underwire-style bras or extremely tight bras.  Air dry your nipples for 3-61minutes after each feeding.   Use only cotton bra pads to absorb leaked breast milk. Leaking of breast milk between feedings is normal.   Use lanolin on your nipples after breastfeeding. Lanolin helps to maintain your skin's normal moisture barrier. If you use pure lanolin, you do not need to wash it off before feeding your baby again. Pure lanolin is not toxic to your baby. You may also hand express a few drops of breast milk and gently massage that milk into your nipples and allow the milk to air dry. In the first few weeks after giving birth, some women experience extremely full breasts (engorgement). Engorgement can make your breasts feel heavy, warm, and tender to the touch. Engorgement peaks within 3-5 days after you give birth. The following recommendations can help ease engorgement:  Completely empty your breasts while  breastfeeding or pumping. You may want to start by applying warm, moist heat (in the shower or with warm water-soaked hand towels) just before feeding or pumping. This increases circulation and helps the milk flow. If your baby does not completely empty your breasts while breastfeeding, pump any extra milk after he or she is finished.  Wear a snug bra (nursing or regular) or tank top for 1-2 days to signal your body to slightly decrease milk production.  Apply ice packs to your breasts, unless this is too uncomfortable for you.  Make sure that your baby is latched on and positioned properly while breastfeeding. If engorgement persists after 48 hours of following these recommendations, contact your health  care provider or a Science writer. OVERALL HEALTH CARE RECOMMENDATIONS WHILE BREASTFEEDING  Eat healthy foods. Alternate between meals and snacks, eating 3 of each per day. Because what you eat affects your breast milk, some of the foods may make your baby more irritable than usual. Avoid eating these foods if you are sure that they are negatively affecting your baby.  Drink milk, fruit juice, and water to satisfy your thirst (about 10 glasses a day).   Rest often, relax, and continue to take your prenatal vitamins to prevent fatigue, stress, and anemia.  Continue breast self-awareness checks.  Avoid chewing and smoking tobacco.  Avoid alcohol and drug use. Some medicines that may be harmful to your baby can pass through breast milk. It is important to ask your health care provider before taking any medicine, including all over-the-counter and prescription medicine as well as vitamin and herbal supplements. It is possible to become pregnant while breastfeeding. If birth control is desired, ask your health care provider about options that will be safe for your baby. SEEK MEDICAL CARE IF:   You feel like you want to stop breastfeeding or have become frustrated with breastfeeding.  You  have painful breasts or nipples.  Your nipples are cracked or bleeding.  Your breasts are red, tender, or warm.  You have a swollen area on either breast.  You have a fever or chills.  You have nausea or vomiting.  You have drainage other than breast milk from your nipples.  Your breasts do not become full before feedings by the fifth day after you give birth.  You feel sad and depressed.  Your baby is too sleepy to eat well.  Your baby is having trouble sleeping.   Your baby is wetting less than 3 diapers in a 24-hour period.  Your baby has less than 3 stools in a 24-hour period.  Your baby's skin or the white part of his or her eyes becomes yellow.   Your baby is not gaining weight by 86 days of age. SEEK IMMEDIATE MEDICAL CARE IF:   Your baby is overly tired (lethargic) and does not want to wake up and feed.  Your baby develops an unexplained fever. Document Released: 11/16/2005 Document Revised: 11/21/2013 Document Reviewed: 05/10/2013 [facility] Patient Information 2015 [place], [place]. This information is not intended to replace advice given to you by your health care provider. Make sure you discuss any questions you have with your health care provider.  Iron-Rich Diet  An iron-rich diet contains foods that are good sources of iron. Iron is an important mineral that helps your body produce hemoglobin. Hemoglobin is a protein in red blood cells that carries oxygen to the body's tissues. Sometimes, the iron level in your blood can be low. This may be caused by:  A lack of iron in your diet.  Blood loss.  Times of growth, such as during pregnancy or during a child's growth and development. Low levels of iron can cause a decrease in the number of red blood cells. This can result in iron deficiency anemia. Iron deficiency anemia symptoms include:  Tiredness.  Weakness.  Irritability.  Increased chance of infection. Here are some recommendations for daily iron  intake:  Males older than 34 years of age need 8 mg of iron per day.  Women ages 23 to 17 need 18 mg of iron per day.  Pregnant women need 27 mg of iron per day, and women who are over 34 years of age and breastfeeding  need 9 mg of iron per day.  Women over the age of 56 need 8 mg of iron per day. SOURCES OF IRON There are 2 types of iron that are found in food: heme iron and nonheme iron. Heme iron is absorbed by the body better than nonheme iron. Heme iron is found in meat, poultry, and fish. Nonheme iron is found in grains, beans, and vegetables. Heme Iron Sources Food / Iron (mg)  Chicken liver, 3 oz (85 g)/ 10 mg  Beef liver, 3 oz (85 g)/ 5.5 mg  Oysters, 3 oz (85 g)/ 8 mg  Beef, 3 oz (85 g)/ 2 to 3 mg  Shrimp, 3 oz (85 g)/ 2.8 mg  [place], 3 oz (85 g)/ 2 mg  Chicken, 3 oz (85 g) / 1 mg  Fish (tuna, halibut), 3 oz (85 g)/ 1 mg  Pork, 3 oz (85 g)/ 0.9 mg Nonheme Iron Sources Food / Iron (mg)  Ready-to-eat breakfast cereal, iron-fortified / 3.9 to 7 mg  Tofu,  cup / 3.4 mg  Kidney beans,  cup / 2.6 mg  Baked potato with skin / 2.7 mg  Asparagus,  cup / 2.2 mg  Avocado / 2 mg  Dried peaches,  cup / 1.6 mg  Raisins,  cup / 1.5 mg  Soy milk, 1 cup / 1.5 mg  Whole-wheat bread, 1 slice / 1.2 mg  Spinach, 1 cup / 0.8 mg  Broccoli,  cup / 0.6 mg IRON ABSORPTION Certain foods can decrease the body's absorption of iron. Try to avoid these foods and beverages while eating meals with iron-containing foods:  Coffee.  Tea.  Fiber.  Soy. Foods containing vitamin C can help increase the amount of iron your body absorbs from iron sources, especially from nonheme sources. Eat foods with vitamin C along with iron-containing foods to increase your iron absorption. Foods that are high in vitamin C include many fruits and vegetables. Some good sources are:  Fresh orange juice.  Oranges.  Strawberries.  Mangoes.  Grapefruit.  Red bell peppers.  Green  bell peppers.  Broccoli.  Potatoes with skin.  Tomato juice. Document Released: 06/30/2005 Document Revised: 02/08/2012 Document Reviewed: 05/07/2011 [facility] Patient Information 2015 [place], [place]. This information is not intended to replace advice given to you by your health care provider. Make sure you discuss any questions you have with your health care provider.

## 2015-07-24 NOTE — Lactation Note (Signed)
This note was copied from the chart of [place]. Lactation Consultation Note: assist mother with latching [name] in football hold. Infant sleepy , after multiple attempts with waking infant he latched on . Adjusted lips for wider gape. Observed rhythmic suckling for 25-30 mins. Mother has a good flow of colostrum. Observed that mothers breast are filling.  1145: Baby B latched on with good burst of suckling for 30 mins. Discussed the use of supplement of EBM/formula. Mother doesn't want to supplement at this time. She was advised to give some hand expressed milk after feedings. She has guidelines for supplementation. She was advised in the use of spoon, cup or #5 fr feeding tube. Mother declined the use of ths SNS. We also discussed a follow up visit next week. Mother states that she sees Peds on Monday and that she will be in school all day on Tuesday. Mother has her own electric pump. She was advised to post pump after each feeding for 20 mins. Mother is aware of need to follow infants weight and protect her milk supply. Discussed that she has a large blood loss and that sometimes milk can be delayed. Advised to rouse infant every 2-3 hours for feedings. Mother receptive to all teaching.   Patient Name: Felicia Cole [id number] Date: 07/24/2015 Reason for consult: Follow-up assessment   Maternal Data    Feeding Feeding Type: Breast Fed Length of feed: 45 min  LATCH Score/Interventions Latch: Repeated attempts needed to sustain latch, nipple held in mouth throughout feeding, stimulation needed to elicit sucking reflex. Intervention(s): Adjust position;Assist with latch;Breast compression  Audible Swallowing: Spontaneous and intermittent Intervention(s): Skin to skin;Hand expression Intervention(s): Hand expression;Alternate breast massage  Type of Nipple: Everted at rest and after stimulation  Comfort (Breast/Nipple): Filling, red/small blisters or bruises, mild/mod  discomfort  Problem noted: Filling  Hold (Positioning): Assistance needed to correctly position infant at breast and maintain latch. Intervention(s): Support Pillows;Position options  LATCH Score: 7  Lactation Tools Discussed/Used     Consult Status Consult Status: Follow-up    Jess Barters [facility] 07/24/2015, 1:33 PM

## 2015-07-24 NOTE — Discharge Summary (Signed)
Obstetric Discharge Summary Reason for Admission: induction of labor Prenatal Procedures: NST and ultrasound Intrapartum Course: Admitted for IOL for twins / Pitocin low dose / epidural for pain management / AROM to augment labor AFTER epidural / normal progression to complete dilation / SVD of viable twin males by Artelia Laroche, CNM / placental abruption of twin B - twin B placenta delivered prior to twin A / no postpartum complications noted Intrapartum Procedures: spontaneous vaginal delivery x 2 Postpartum Procedures: none Complications-Operative and Postpartum: 2nd degree perineal laceration HEMOGLOBIN  Date Value Ref Range Status  07/23/2015 8.1* 12.0 - 15.0 g/dL Final    Comment:    DELTA CHECK NOTED REPEATED TO VERIFY    HCT  Date Value Ref Range Status  07/23/2015 23.1* 36.0 - 46.0 % Final    Physical Exam:  General: alert, cooperative, fatigued and no distress Lochia: appropriate Uterine Fundus: firm, midline, U-2 Perineum: healing well, no significant drainage, no dehiscence, no significant erythema DVT Evaluation: No evidence of DVT seen on physical exam. Negative Homan's sign. No cords or calf tenderness. No significant calf/ankle edema.  Discharge Diagnoses: Term Pregnancy-delivered and twin preg - delivered  Discharge Information: Date: 07/24/2015 Activity: pelvic rest Diet: routine Medications: PNV, Ibuprofen, Iron and magnesium oxide Condition: stable Instructions: refer to practice specific booklet Discharge to: home Follow-up Information    Follow up with Artelia Laroche, CNM. Schedule an appointment as soon as possible for a visit in 6 weeks.   Specialty:  Obstetrics and Gynecology   Why:  postpartum visit   Contact information:   [place] [address] [phone]       Newborn Data:   Adler, Alton [[id number]]  Live born female on 07/23/2015 Birth Weight: 6 lb 12.3 oz (3070 g) APGAR: 9, 9   Monik, Lins [[id number]]   Live born female on 07/23/2015 Birth Weight: 6 lb 6.5 oz (2905 g) APGAR: 2, 7  Home with mother.  Laury Deep, M MSN, CNM 07/24/2015, 9:14 AM

## 2015-08-08 ENCOUNTER — Encounter: Payer: Self-pay | Admitting: Physician Assistant

## 2015-08-23 ENCOUNTER — Ambulatory Visit (INDEPENDENT_AMBULATORY_CARE_PROVIDER_SITE_OTHER): Payer: Managed Care, Other (non HMO)

## 2015-08-23 ENCOUNTER — Encounter: Payer: Self-pay | Admitting: Osteopathic Medicine

## 2015-08-23 ENCOUNTER — Ambulatory Visit (INDEPENDENT_AMBULATORY_CARE_PROVIDER_SITE_OTHER): Payer: Managed Care, Other (non HMO) | Admitting: Osteopathic Medicine

## 2015-08-23 VITALS — BP 95/65 | HR 75 | Ht 61.5 in | Wt 124.0 lb

## 2015-08-23 DIAGNOSIS — Z23 Encounter for immunization: Secondary | ICD-10-CM | POA: Diagnosis not present

## 2015-08-23 DIAGNOSIS — M79671 Pain in right foot: Secondary | ICD-10-CM | POA: Diagnosis not present

## 2015-08-23 DIAGNOSIS — R109 Unspecified abdominal pain: Secondary | ICD-10-CM

## 2015-08-23 DIAGNOSIS — M7918 Myalgia, other site: Secondary | ICD-10-CM

## 2015-08-23 DIAGNOSIS — M9908 Segmental and somatic dysfunction of rib cage: Secondary | ICD-10-CM | POA: Diagnosis not present

## 2015-08-23 DIAGNOSIS — M722 Plantar fascial fibromatosis: Secondary | ICD-10-CM | POA: Diagnosis not present

## 2015-08-23 MED ORDER — IBUPROFEN 800 MG PO TABS: 800.0000 mg | ORAL_TABLET | Freq: Three times a day (TID) | ORAL | Status: DC | PRN

## 2015-08-23 MED ORDER — CYCLOBENZAPRINE HCL 5 MG PO TABS: 5.0000 mg | ORAL_TABLET | Freq: Three times a day (TID) | ORAL | Status: DC | PRN

## 2015-08-23 NOTE — Patient Instructions (Signed)
Will try conservative measures as below. If no improvement i 1 - 2 weeks, will refer to sports med for orthotics and further evaluation/treatment.   Tendinitis Tendinitis is swelling and inflammation of the tendons. Tendons are band-like tissues that connect muscle to bone. Tendinitis commonly occurs in the:   Shoulders (rotator cuff).  Heels (Achilles tendon).  Elbows (triceps tendon). CAUSES Tendinitis is usually caused by overusing the tendon, muscles, and joints involved. When the tissue surrounding a tendon (synovium) becomes inflamed, it is called tenosynovitis. Tendinitis commonly develops in people whose jobs require repetitive motions. SYMPTOMS  Pain.  Tenderness.  Mild swelling. DIAGNOSIS Tendinitis is usually diagnosed by physical exam. Your health care provider may also order X-rays or other imaging tests. TREATMENT Your health care provider may recommend certain medicines or exercises for your treatment. HOME CARE INSTRUCTIONS   Use a sling or splint for as long as directed by your health care provider until the pain decreases.  Put ice on the injured area.  Put ice in a plastic bag.  Place a towel between your skin and the bag.  Leave the ice on for 15-20 minutes, 3-4 times a day, or as directed by your health care provider.  Avoid using the limb while the tendon is painful. Perform gentle range of motion exercises only as directed by your health care provider. Stop exercises if pain or discomfort increase, unless directed otherwise by your health care provider.  Only take over-the-counter or prescription medicines for pain, discomfort, or fever as directed by your health care provider. SEEK MEDICAL CARE IF:   Your pain and swelling increase.  You develop new, unexplained symptoms, especially increased numbness in the hands. MAKE SURE YOU:   Understand these instructions.  Will watch your condition.  Will get help right away if you are not doing well or  get worse. Document Released: 11/13/2000 Document Revised: 04/02/2014 Document Reviewed: 02/02/2011 [facility] Patient Information 2015 [place], [place]. This information is not intended to replace advice given to you by your health care provider. Make sure you discuss any questions you have with your health care provider.  Plantar Fasciitis Plantar fasciitis is a common condition that causes foot pain. It is soreness (inflammation) of the band of tough fibrous tissue on the bottom of the foot that runs from the heel bone (calcaneus) to the ball of the foot. The cause of this soreness may be from excessive standing, poor fitting shoes, running on hard surfaces, being overweight, having an abnormal walk, or overuse (this is common in runners) of the painful foot or feet. It is also common in aerobic exercise dancers and ballet dancers. SYMPTOMS  Most people with plantar fasciitis complain of:  Severe pain in the morning on the bottom of their foot especially when taking the first steps out of bed. This pain recedes after a few minutes of walking.  Severe pain is experienced also during walking following a long period of inactivity.  Pain is worse when walking barefoot or up stairs DIAGNOSIS   Your caregiver will diagnose this condition by examining and feeling your foot.  Special tests such as X-rays of your foot, are usually not needed. PREVENTION   Consult a sports medicine professional before beginning a new exercise program.  Walking programs offer a good workout. With walking there is a lower chance of overuse injuries common to runners. There is less impact and less jarring of the joints.  Begin all new exercise programs slowly. If problems or pain develop, decrease  the amount of time or distance until you are at a comfortable level.  Wear good shoes and replace them regularly.  Stretch your foot and the heel cords at the back of the ankle (Achilles tendon) both before and after  exercise.  Run or exercise on even surfaces that are not hard. For example, asphalt is better than pavement.  Do not run barefoot on hard surfaces.  If using a treadmill, vary the incline.  Do not continue to workout if you have foot or joint problems. Seek professional help if they do not improve. HOME CARE INSTRUCTIONS   Avoid activities that cause you pain until you recover.  Use ice or cold packs on the problem or painful areas after working out.  Only take over-the-counter or prescription medicines for pain, discomfort, or fever as directed by your caregiver.  Soft shoe inserts or athletic shoes with air or gel sole cushions may be helpful.  If problems continue or become more severe, consult a sports medicine caregiver or your own health care provider. Cortisone is a potent anti-inflammatory medication that may be injected into the painful area. You can discuss this treatment with your caregiver. MAKE SURE YOU:   Understand these instructions.  Will watch your condition.  Will get help right away if you are not doing well or get worse. Document Released: 08/11/2001 Document Revised: 02/08/2012 Document Reviewed: 10/10/2008 [facility] Patient Information 2015 [place], [place]. This information is not intended to replace advice given to you by your health care provider. Make sure you discuss any questions you have with your health care provider.

## 2015-08-23 NOTE — Progress Notes (Signed)
HPI: Felicia Cole is a 34 y.o. female who presents to [facility]  today for chief complaint of:  Chief Complaint  Patient presents with  . Establish Care  . Foot Pain    right  . Back Pain   . Location: right foot . Severity: moderate . Duration: 2 - 3 weeks . Timing: sudden onset . Context: no injury, this just started on own . Assoc signs/symptoms: see Red Flags below Red Flag Symptoms: Parasthesia (DM, Lead/M[place]ury): no Characteristic of Gout: no B/L Edema: no   Past medical, social and family history reviewed: Past Medical History  Diagnosis Date  . Allergy   . Anxiety   . Asthma   . Herpesvirus 2   . Vitamin D deficiency    History reviewed. No pertinent past surgical history. Social History  Substance Use Topics  . Smoking status: Never Smoker   . Smokeless tobacco: Never Used  . Alcohol Use: No   Family History  Problem Relation Age of Onset  . Hypertension Mother   . Anxiety disorder Mother   . Thyroid disease Mother   . Epilepsy Father   . ALS Father   . Cancer Maternal Aunt     breast  . Heart disease Maternal Grandmother   . Cancer Maternal Grandmother     breast  . Heart disease Maternal Grandfather   . Cancer Paternal Grandmother     ovarian  . Heart disease Paternal Grandmother   . Heart disease Paternal Grandfather   . Hypertension Paternal Grandfather     Current Outpatient Prescriptions  Medication Sig Dispense Refill  . Cetirizine HCl (ZYRTEC PO) Take 10 mg by mouth daily.    . citalopram (CELEXA) 20 MG tablet Take 20 mg by mouth daily.    . Prenatal Vit-Fe Fumarate-FA (PRENATAL MULTIVITAMIN) TABS tablet Take 2 tablets by mouth daily at 12 noon.    . cyclobenzaprine (FLEXERIL) 5 MG tablet Take 1 tablet (5 mg total) by mouth 3 (three) times daily as needed for muscle spasms. 20 tablet 1  . EPINEPHrine (EPIPEN 2-PAK IJ) Inject 1 applicator as directed once as needed (severe allergic reaction).    Marland Kitchen  ibuprofen (ADVIL,MOTRIN) 800 MG tablet Take 1 tablet (800 mg total) by mouth every 8 (eight) hours as needed. 40 tablet 2   No current facility-administered medications for this visit.   Allergies  Allergen Reactions  . Bee Venom Anaphylaxis    Patient carries epipen  . Shellfish Allergy Anaphylaxis    Patient carries epipen      Review of Systems: per form filled out by patient today CONSTITUTIONAL: Neg fever/chills, no unintentional weight changes HEAD/EYES/EARS/NOSE/THROAT: No headache/vision change or hearing change, no sore throat CARDIAC: No chest pain/pressure/palpitations, no orthopnea RESPIRATORY: No cough/shortness of breath/wheeze GASTROINTESTINAL: No nausea/vomiting/abdominal pain/blood in stool/diarrhea/constipation MUSCULOSKELETAL: (+) myalgia/arthralgia foot and back pain as per HPI GENITOURINARY: No incontinence, No abnormal genital bleeding/discharge SKIN: No rash/wounds/concerning lesions HEM/ONC: No easy bruising/bleeding, no abnormal lymph node ENDOCRINE: No polyuria/polydipsia/polyphagia, no heat/cold intolerance  NEUROLOGIC: No weakness/dizzines/slurred speech PSYCHIATRIC: No concerns with depression or sleep problems. (+) concern for anxiety   Exam:  BP 95/65 mmHg  Pulse 75  Ht 5' 1.5" (1.562 m)  Wt 124 lb (56.246 kg)  BMI 23.05 kg/m2 Constitutional: VSS, see above. General Appearance: alert, well-developed, well-nourished, NAD Musculoskeletal: Gait normal. No clubbing/cyanosis of digits.  [place] RULES: R foot Tender distal 6cm Medial Malleolus: no Tender distal 6cm Lateral Malleolus: no Unable to  bear weight after injury & unable to go 4 steps on exam: no  R foot/ankle: Normal inversion/eversion against resistance. (+) tenderness with forced plantarflexsion pain at ball of foot, no heel pain, normal alignment, normal pulses and sensation, no edema Back: (+)TART changes on R posterior ribs level 9-12 in flank region, normal rib  motion Neurological: No cranial nerve deficit on limited exam. Motor and sensation intact and symmetric Psychiatric: Normal judgment/insight. Normal mood and affect. Oriented x3.    No results found for this or any previous visit (from the past 72 hour(s)).    ASSESSMENT/PLAN:  Right foot pain - Plan: DG Foot Complete Right, ibuprofen (ADVIL,MOTRIN) 800 MG tablet  Plantar fasciitis of right foot  Somatic dysfunction of rib cage region  Muscular abdominal pain in right flank - Plan: cyclobenzaprine (FLEXERIL) 5 MG tablet, ibuprofen (ADVIL,MOTRIN) 800 MG tablet   Not convinced of classic plantar fasciitis since pain is not at heel, more likely tendinitis, pt wearing flip flops usually, doesn't leave house often since recently giving birth to twins.I advised sneakers/supportive footwear even around the house, rest/ice and stretching exercises. If no improvement can consider sports med referral for orthotics, Xray pending.   Not breastfeeding.  OMT attempted to relieve back pain, HVLA unsuccessful, rib muscle energy and myofascial release somewhat helpful. Advised heat and stretching, walking as tolerated. RTC if no improvement. Meds as above.   Flu shot given today.

## 2015-08-26 ENCOUNTER — Other Ambulatory Visit: Payer: Self-pay | Admitting: Internal Medicine

## 2015-10-30 ENCOUNTER — Encounter (HOSPITAL_COMMUNITY): Payer: Self-pay

## 2015-11-04 ENCOUNTER — Other Ambulatory Visit: Payer: Self-pay | Admitting: Obstetrics and Gynecology

## 2015-11-15 ENCOUNTER — Encounter (HOSPITAL_COMMUNITY): Payer: Self-pay | Admitting: *Deleted

## 2015-11-15 ENCOUNTER — Ambulatory Visit (HOSPITAL_COMMUNITY): Payer: Managed Care, Other (non HMO) | Admitting: Anesthesiology

## 2015-11-15 ENCOUNTER — Ambulatory Visit (HOSPITAL_COMMUNITY)
Admission: RE | Admit: 2015-11-15 | Discharge: 2015-11-15 | Disposition: A | Discharge status: Home / Self Care | Payer: Managed Care, Other (non HMO) | Source: Ambulatory Visit | Attending: Obstetrics and Gynecology | Admitting: Obstetrics and Gynecology

## 2015-11-15 ENCOUNTER — Encounter (HOSPITAL_COMMUNITY)
Admission: RE | Disposition: A | Discharge status: Home / Self Care | Payer: Self-pay | Source: Ambulatory Visit | Attending: Obstetrics and Gynecology

## 2015-11-15 DIAGNOSIS — Z302 Encounter for sterilization: Secondary | ICD-10-CM | POA: Insufficient documentation

## 2015-11-15 HISTORY — PX: LAPAROSCOPIC TUBAL LIGATION: SHX1937

## 2015-11-15 LAB — CBC
HEMATOCRIT: 35.7 % — AB (ref 36.0–46.0)
HEMOGLOBIN: 11.8 g/dL — AB (ref 12.0–15.0)
MCH: 29 pg (ref 26.0–34.0)
MCHC: 33.1 g/dL (ref 30.0–36.0)
MCV: 87.7 fL (ref 78.0–100.0)
Platelets: 257 10*3/uL (ref 150–400)
RBC: 4.07 MIL/uL (ref 3.87–5.11)
RDW: 14.2 % (ref 11.5–15.5)
WBC: 7.3 10*3/uL (ref 4.0–10.5)

## 2015-11-15 LAB — HCG, SERUM, QUALITATIVE: PREG SERUM: NEGATIVE

## 2015-11-15 SURGERY — LIGATION, FALLOPIAN TUBE, LAPAROSCOPIC
Anesthesia: General | Site: Abdomen | Laterality: Bilateral

## 2015-11-15 MED ORDER — DEXAMETHASONE SODIUM PHOSPHATE 4 MG/ML IJ SOLN
INTRAMUSCULAR | Status: AC
  Filled 2015-11-15: qty 1

## 2015-11-15 MED ORDER — DEXAMETHASONE SODIUM PHOSPHATE 10 MG/ML IJ SOLN
INTRAMUSCULAR | Status: DC | PRN
  Administered 2015-11-15: 4 mg via INTRAVENOUS

## 2015-11-15 MED ORDER — LIDOCAINE HCL (CARDIAC) 20 MG/ML IV SOLN
INTRAVENOUS | Status: AC
  Filled 2015-11-15: qty 5

## 2015-11-15 MED ORDER — KETOROLAC TROMETHAMINE 30 MG/ML IJ SOLN
INTRAMUSCULAR | Status: DC | PRN
  Administered 2015-11-15: 30 mg via INTRAVENOUS

## 2015-11-15 MED ORDER — BUPIVACAINE HCL (PF) 0.25 % IJ SOLN
INTRAMUSCULAR | Status: AC
  Filled 2015-11-15: qty 30

## 2015-11-15 MED ORDER — SCOPOLAMINE 1 MG/3DAYS TD PT72
1.0000 | MEDICATED_PATCH | Freq: Once | TRANSDERMAL | Status: DC
  Administered 2015-11-15: 1.5 mg via TRANSDERMAL

## 2015-11-15 MED ORDER — ROCURONIUM BROMIDE 100 MG/10ML IV SOLN
INTRAVENOUS | Status: DC | PRN
  Administered 2015-11-15: 5 mg via INTRAVENOUS

## 2015-11-15 MED ORDER — ONDANSETRON HCL 4 MG/2ML IJ SOLN
INTRAMUSCULAR | Status: DC | PRN
  Administered 2015-11-15: 4 mg via INTRAVENOUS

## 2015-11-15 MED ORDER — GLYCOPYRROLATE 0.2 MG/ML IJ SOLN
INTRAMUSCULAR | Status: DC | PRN
  Administered 2015-11-15: 0.2 mg via INTRAVENOUS

## 2015-11-15 MED ORDER — SUCCINYLCHOLINE CHLORIDE 20 MG/ML IJ SOLN
INTRAMUSCULAR | Status: DC | PRN
  Administered 2015-11-15: 100 mg via INTRAVENOUS

## 2015-11-15 MED ORDER — OXYCODONE HCL 5 MG/5ML PO SOLN: 5.0000 mg | Freq: Once | ORAL | Status: DC | PRN

## 2015-11-15 MED ORDER — OXYCODONE-ACETAMINOPHEN 5-325 MG PO TABS: 1.0000 | ORAL_TABLET | ORAL | Status: DC | PRN

## 2015-11-15 MED ORDER — FENTANYL CITRATE (PF) 100 MCG/2ML IJ SOLN: 25.0000 ug | INTRAMUSCULAR | Status: DC | PRN

## 2015-11-15 MED ORDER — MIDAZOLAM HCL 2 MG/2ML IJ SOLN
INTRAMUSCULAR | Status: DC | PRN
  Administered 2015-11-15: 2 mg via INTRAVENOUS

## 2015-11-15 MED ORDER — FENTANYL CITRATE (PF) 100 MCG/2ML IJ SOLN
INTRAMUSCULAR | Status: DC | PRN
  Administered 2015-11-15 (×2): 50 ug via INTRAVENOUS

## 2015-11-15 MED ORDER — ROCURONIUM BROMIDE 100 MG/10ML IV SOLN
INTRAVENOUS | Status: AC
  Filled 2015-11-15: qty 1

## 2015-11-15 MED ORDER — OXYCODONE HCL 5 MG PO TABS: 5.0000 mg | ORAL_TABLET | Freq: Once | ORAL | Status: DC | PRN

## 2015-11-15 MED ORDER — LIDOCAINE HCL (CARDIAC) 20 MG/ML IV SOLN
INTRAVENOUS | Status: DC | PRN
  Administered 2015-11-15: 50 mg via INTRAVENOUS

## 2015-11-15 MED ORDER — KETOROLAC TROMETHAMINE 30 MG/ML IJ SOLN
INTRAMUSCULAR | Status: AC
  Filled 2015-11-15: qty 1

## 2015-11-15 MED ORDER — BUPIVACAINE HCL (PF) 0.25 % IJ SOLN
INTRAMUSCULAR | Status: DC | PRN
  Administered 2015-11-15: 10 mL

## 2015-11-15 MED ORDER — ONDANSETRON HCL 4 MG/2ML IJ SOLN: 4.0000 mg | Freq: Once | INTRAMUSCULAR | Status: DC | PRN

## 2015-11-15 MED ORDER — PROPOFOL 10 MG/ML IV BOLUS
INTRAVENOUS | Status: AC
  Filled 2015-11-15: qty 20

## 2015-11-15 MED ORDER — SUCCINYLCHOLINE CHLORIDE 20 MG/ML IJ SOLN
INTRAMUSCULAR | Status: AC
  Filled 2015-11-15: qty 1

## 2015-11-15 MED ORDER — MIDAZOLAM HCL 2 MG/2ML IJ SOLN
INTRAMUSCULAR | Status: AC
  Filled 2015-11-15: qty 2

## 2015-11-15 MED ORDER — KETOROLAC TROMETHAMINE 30 MG/ML IJ SOLN: 30.0000 mg | Freq: Once | INTRAMUSCULAR | Status: DC | PRN

## 2015-11-15 MED ORDER — LACTATED RINGERS IV SOLN
INTRAVENOUS | Status: DC
  Administered 2015-11-15: 10:00:00 via INTRAVENOUS

## 2015-11-15 MED ORDER — ONDANSETRON HCL 4 MG/2ML IJ SOLN
INTRAMUSCULAR | Status: AC
  Filled 2015-11-15: qty 2

## 2015-11-15 MED ORDER — SODIUM CHLORIDE 0.9 % IJ SOLN
INTRAMUSCULAR | Status: AC
  Filled 2015-11-15: qty 10

## 2015-11-15 MED ORDER — FENTANYL CITRATE (PF) 250 MCG/5ML IJ SOLN
INTRAMUSCULAR | Status: AC
  Filled 2015-11-15: qty 5

## 2015-11-15 MED ORDER — PROPOFOL 10 MG/ML IV BOLUS
INTRAVENOUS | Status: DC | PRN
  Administered 2015-11-15: 150 mg via INTRAVENOUS

## 2015-11-15 MED ORDER — MEPERIDINE HCL 25 MG/ML IJ SOLN: 6.2500 mg | INTRAMUSCULAR | Status: DC | PRN

## 2015-11-15 SURGICAL SUPPLY — 21 items
CATH ROBINSON RED A/P 16FR (CATHETERS) ×2 IMPLANT
CLOTH BEACON ORANGE TIMEOUT ST (SAFETY) ×2 IMPLANT
DRSG COVADERM PLUS 2X2 (GAUZE/BANDAGES/DRESSINGS) ×4 IMPLANT
DRSG OPSITE POSTOP 3X4 (GAUZE/BANDAGES/DRESSINGS) ×2 IMPLANT
GLOVE BIO SURGEON STRL SZ7.5 (GLOVE) ×2 IMPLANT
GLOVE BIOGEL PI IND STRL 7.0 (GLOVE) ×1 IMPLANT
GLOVE BIOGEL PI INDICATOR 7.0 (GLOVE) ×1
GOWN STRL REUS W/TWL LRG LVL3 (GOWN DISPOSABLE) ×4 IMPLANT
LIQUID BAND (GAUZE/BANDAGES/DRESSINGS) ×2 IMPLANT
NEEDLE INSUFFLATION 120MM (ENDOMECHANICALS) ×2 IMPLANT
PACK LAPAROSCOPY BASIN (CUSTOM PROCEDURE TRAY) ×2 IMPLANT
PAD POSITIONING PINK XL (MISCELLANEOUS) ×2 IMPLANT
SLEEVE XCEL OPT CAN 5 100 (ENDOMECHANICALS) ×2 IMPLANT
SOLUTION ELECTROLUBE (MISCELLANEOUS) ×2 IMPLANT
SUT VICRYL 0 UR6 27IN ABS (SUTURE) ×2 IMPLANT
SUT VICRYL 4-0 PS2 18IN ABS (SUTURE) IMPLANT
TOWEL OR 17X24 6PK STRL BLUE (TOWEL DISPOSABLE) ×4 IMPLANT
TROCAR OPTI TIP 5M 100M (ENDOMECHANICALS) ×2 IMPLANT
TROCAR XCEL DIL TIP R 11M (ENDOMECHANICALS) ×2 IMPLANT
WARMER LAPAROSCOPE (MISCELLANEOUS) ×2 IMPLANT
WATER STERILE IRR 1000ML POUR (IV SOLUTION) ×2 IMPLANT

## 2015-11-15 NOTE — Anesthesia Procedure Notes (Signed)
Procedure Name: Intubation Date/Time: 11/15/2015 10:26 AM Performed by: Jonna Munro Pre-anesthesia Checklist: Patient identified, Emergency Drugs available, Suction available, Patient being monitored and Timeout performed Patient Re-evaluated:Patient Re-evaluated prior to inductionOxygen Delivery Method: Circle system utilized Preoxygenation: Pre-oxygenation with 100% oxygen Intubation Type: IV induction Laryngoscope Size: Mac and 3 Grade View: Grade I Tube type: Oral Tube size: 7.0 mm Number of attempts: 2 Airway Equipment and Method: Stylet Placement Confirmation: ETT inserted through vocal cords under direct vision Secured at: 21 cm Tube secured with: Tape Dental Injury: Teeth and Oropharynx as per pre-operative assessment

## 2015-11-15 NOTE — Transfer of Care (Signed)
Immediate Anesthesia Transfer of Care Note  Patient: Felicia Cole  Procedure(s) Performed: Procedure(s): LAPAROSCOPIC TUBAL LIGATION (Bilateral)  Patient Location: PACU  Anesthesia Type:General  Level of Consciousness: awake, alert  and oriented  Airway & Oxygen Therapy: Patient Spontanous Breathing and Patient connected to nasal cannula oxygen  Post-op Assessment: Report given to RN and Post -op Vital signs reviewed and stable  Post vital signs: Reviewed and stable  Last Vitals:  Filed Vitals:   11/15/15 0913  BP: 94/73  Pulse: 83  Temp: 36.8 C  Resp: 18    Complications: No apparent anesthesia complications

## 2015-11-15 NOTE — Op Note (Signed)
11/15/2015  11:13 AM  PATIENT:  Felicia Cole  34 y.o. female  PRE-OPERATIVE DIAGNOSIS:  Desires Sterilization  POST-OPERATIVE DIAGNOSIS:  desires sterilization  PROCEDURE:  Procedure(s): LAPAROSCOPIC TUBAL LIGATION  SURGEON:  Surgeon(s): Brien Few, MD  ASSISTANTS: none   ANESTHESIA:   local and general  ESTIMATED BLOOD LOSS: minimal  DRAINS: none   LOCAL MEDICATIONS USED:  MARCAINE    and Amount: 20 ml  SPECIMEN:  No Specimen  DISPOSITION OF SPECIMEN:  PATHOLOGY  COUNTS:  YES  DICTATION #[id number]  PLAN OF CARE: dc home  PATIENT DISPOSITION:  PACU - hemodynamically stable.

## 2015-11-15 NOTE — H&P (Signed)
Felicia Cole is an 34 y.o. female for elective sterilization.  Pertinent Gynecological History: Menses: flow is moderate Bleeding: na Contraception: none DES exposure: denies Blood transfusions: none Sexually transmitted diseases: no past history Previous GYN Procedures: na  Last mammogram: normal Date: na Last pap: normal Date: na OB History: G2, P2   Menstrual History: Menarche age: 89  Patient's last menstrual period was 10/09/2015 (approximate).    Past Medical History  Diagnosis Date  . Allergy   . Anxiety   . Asthma   . Herpesvirus 2   . Vitamin D deficiency     History reviewed. No pertinent past surgical history.  Family History  Problem Relation Age of Onset  . Hypertension Mother   . Anxiety disorder Mother   . Thyroid disease Mother   . Epilepsy Father   . ALS Father   . Cancer Maternal Aunt     breast  . Heart disease Maternal Grandmother   . Cancer Maternal Grandmother     breast  . Heart disease Maternal Grandfather   . Cancer Paternal Grandmother     ovarian  . Heart disease Paternal Grandmother   . Heart disease Paternal Grandfather   . Hypertension Paternal Grandfather     Social History:  reports that she has never smoked. She has never used smokeless tobacco. She reports that she does not drink alcohol or use illicit drugs.  Allergies:  Allergies  Allergen Reactions  . Bee Venom Anaphylaxis    Patient carries epipen  . Shellfish Allergy Anaphylaxis    Patient carries epipen    No prescriptions prior to admission    Review of Systems  Constitutional: Negative.   All other systems reviewed and are negative.   Last menstrual period 10/09/2015, not currently breastfeeding. Physical Exam  Constitutional: She is oriented to person, place, and time. She appears well-developed and well-nourished.  HENT:  Head: Normocephalic and atraumatic.  Neck: Normal range of motion.  Cardiovascular: Normal rate and regular rhythm.    Respiratory: Effort normal and breath sounds normal.  GI: Soft. Bowel sounds are normal.  Genitourinary: Vagina normal and uterus normal.  Musculoskeletal: Normal range of motion.  Neurological: She is alert and oriented to person, place, and time. She has normal reflexes.  Skin: Skin is warm and dry.    No results found for this or any previous visit (from the past 24 hour(s)).  No results found.  Assessment/Plan: Desire for elective sterilization. Declines IUD or Essure. LTL Consent done.  Felicia Cole J 11/15/2015, 8:43 AM

## 2015-11-15 NOTE — Progress Notes (Signed)
Patient ID: Felicia Cole, female   DOB: 1981-06-11, 34 y.o.   MRN: [id number] Patient seen and examined. Consent witnessed and signed. No changes noted. Update completed.

## 2015-11-15 NOTE — Anesthesia Preprocedure Evaluation (Signed)
Anesthesia Evaluation  Patient identified by MRN, date of birth, ID band Patient awake    Reviewed: Allergy & Precautions, H&P , Patient's Chart, lab work & pertinent test results, reviewed documented beta blocker date and time   Airway Mallampati: I  TM Distance: >3 FB Neck ROM: full    Dental  (+) Teeth Intact   Pulmonary    Pulmonary exam normal        Cardiovascular negative cardio ROS Normal cardiovascular exam     Neuro/Psych negative neurological ROS     GI/Hepatic negative GI ROS, Neg liver ROS,   Endo/Other  negative endocrine ROS  Renal/GU negative Renal ROS     Musculoskeletal   Abdominal Normal abdominal exam  (+)   Peds  Hematology negative hematology ROS (+)   Anesthesia Other Findings   Reproductive/Obstetrics negative OB ROS                             Anesthesia Physical Anesthesia Plan  ASA: II  Anesthesia Plan: General   Post-op Pain Management:    Induction: Intravenous  Airway Management Planned: Oral ETT  Additional Equipment:   Intra-op Plan:   Post-operative Plan: Extubation in OR  Informed Consent: I have reviewed the patients History and Physical, chart, labs and discussed the procedure including the risks, benefits and alternatives for the proposed anesthesia with the patient or authorized representative who has indicated his/her understanding and acceptance.   Dental Advisory Given  Plan Discussed with: CRNA and Surgeon  Anesthesia Plan Comments:         Anesthesia Quick Evaluation

## 2015-11-15 NOTE — Discharge Instructions (Signed)

## 2015-11-15 NOTE — Anesthesia Postprocedure Evaluation (Signed)
Anesthesia Post Note  Patient: Felicia Cole  Procedure(s) Performed: Procedure(s) (LRB): LAPAROSCOPIC TUBAL LIGATION (Bilateral)  Patient location during evaluation: PACU Anesthesia Type: General Level of consciousness: awake and alert Pain management: pain level controlled Vital Signs Assessment: post-procedure vital signs reviewed and stable Respiratory status: spontaneous breathing, nonlabored ventilation and respiratory function stable Cardiovascular status: blood pressure returned to baseline and stable Postop Assessment: no signs of nausea or vomiting Anesthetic complications: no    Last Vitals:  Filed Vitals:   11/15/15 1145 11/15/15 1215  BP: 98/66 96/61  Pulse: 79 67  Temp:  36.6 C  Resp: 16 18    Last Pain: There were no vitals filed for this visit.               Zenaida Deed

## 2015-11-16 NOTE — Op Note (Signed)
NAMEXOCHITL, KOPKO               ACCOUNT NO.:  [id number]  MEDICAL RECORD NO.:  [id number]  LOCATION:  [facility]                          FACILITY:  [facility]  PHYSICIAN:  Lovenia Kim, M.D.DATE OF BIRTH:  [birth year]  DATE OF PROCEDURE: DATE OF DISCHARGE:  11/15/2015                              OPERATIVE REPORT   PREOPERATIVE DIAGNOSIS:  Desire for elective sterilization.  POSTOPERATIVE DIAGNOSIS:  Desire for elective sterilization.  PROCEDURE:  Laparoscopic tubal ligation.  SURGEON:  Lovenia Kim, M.D.  ASSISTANT:  None.  ANESTHESIA:  General, local.  ESTIMATED BLOOD LOSS:  Less than 50 mL.  COMPLICATIONS:  None.  DRAINS:  None.  COUNTS:  Correct.  DISPOSITION:  The patient to recovery in good condition.  BRIEF OPERATIVE NOTE:  After being apprised of risks of anesthesia, infection, bleeding, injury to surrounding organs, possible need for repair, delayed versus immediate complications, which include bowel and bladder injury, possible need for repair, the patient was brought to the operating room where she was administered a general anesthetic without complications.  Prepped and draped in usual sterile fashion.  Feet were placed in [place].  Exam under anesthesia revealed anteflexed uterus.  No adnexal masses.  Hulka tenaculum placed vaginally. Infraumbilical incision made with a scalpel.  Veress needle placed with opening pressure -1, 3 L of CO2 insufflated without difficulty.  Trocar was placed atraumatically.  Visualization revealed normal liver, gallbladder bed.  Normal appendiceal area.  Normal trocar entry site. Normal uterus.  Normal anterior and posterior cul-de-sac.  Normal bilateral adnexa.  At this time, the right tube was traced out to the fimbriated end and ampullary-isthmic portion of the tube was identified, cauterized using bipolar cautery in 3 contiguous areas.  The same procedure was done on the left tube after identifying an  ampullary- isthmic portion of the tube.  Both tubes were then divided using hook scissors, and the area were cauterized and tubal lumens were visualized. Good hemostasis was noted.  CO2 was released.  Hemostasis was assured.  The trocar was removed after positive pressure was applied.  Incision was infiltrated with a dilute Marcaine solution and closed using 0 Vicryl, 4-0 Monocryl, and Dermabond.  Hulka tenaculum removed.  The patient tolerated the procedure well and was transferred to recovery in good condition.     Lovenia Kim, M.D.     RJT/MEDQ  D:  11/15/2015  T:  11/16/2015  Job:  [id number]

## 2015-11-18 ENCOUNTER — Encounter (HOSPITAL_COMMUNITY): Payer: Self-pay | Admitting: Obstetrics and Gynecology

## 2015-12-26 ENCOUNTER — Encounter: Payer: Self-pay | Admitting: Family Medicine

## 2015-12-26 ENCOUNTER — Ambulatory Visit (INDEPENDENT_AMBULATORY_CARE_PROVIDER_SITE_OTHER): Payer: Managed Care, Other (non HMO) | Admitting: Family Medicine

## 2015-12-26 VITALS — BP 90/60 | HR 77 | Wt 120.0 lb

## 2015-12-26 DIAGNOSIS — F419 Anxiety disorder, unspecified: Secondary | ICD-10-CM

## 2015-12-26 DIAGNOSIS — Z87892 Personal history of anaphylaxis: Secondary | ICD-10-CM | POA: Insufficient documentation

## 2015-12-26 DIAGNOSIS — D352 Benign neoplasm of pituitary gland: Secondary | ICD-10-CM

## 2015-12-26 DIAGNOSIS — R110 Nausea: Secondary | ICD-10-CM

## 2015-12-26 LAB — COMPREHENSIVE METABOLIC PANEL
ALT: 21 U/L (ref 6–29)
AST: 23 U/L (ref 10–30)
Albumin: 4.4 g/dL (ref 3.6–5.1)
Alkaline Phosphatase: 54 U/L (ref 33–115)
BUN: 9 mg/dL (ref 7–25)
CHLORIDE: 105 mmol/L (ref 98–110)
CO2: 25 mmol/L (ref 20–31)
Calcium: 8.9 mg/dL (ref 8.6–10.2)
Creat: 0.78 mg/dL (ref 0.50–1.10)
Glucose, Bld: 66 mg/dL (ref 65–99)
POTASSIUM: 3.8 mmol/L (ref 3.5–5.3)
SODIUM: 140 mmol/L (ref 135–146)
TOTAL PROTEIN: 7 g/dL (ref 6.1–8.1)
Total Bilirubin: 0.6 mg/dL (ref 0.2–1.2)

## 2015-12-26 LAB — PROLACTIN: PROLACTIN: 2.1 ng/mL

## 2015-12-26 LAB — TSH: TSH: 0.545 u[IU]/mL (ref 0.350–4.500)

## 2015-12-26 LAB — LIPASE: Lipase: 42 U/L (ref 7–60)

## 2015-12-26 MED ORDER — DIAZEPAM 5 MG PO TABS: ORAL_TABLET | ORAL | Status: DC

## 2015-12-26 MED ORDER — EPINEPHRINE 0.3 MG/0.3ML IJ SOAJ: 0.3000 mg | Freq: Once | INTRAMUSCULAR | Status: AC

## 2015-12-26 NOTE — Assessment & Plan Note (Addendum)
Could be growing given recent symptoms though no concerns for overt mass effect. WIll get serum prolactin and will schedule MRI given over 9 years since this has been evaluated.

## 2015-12-26 NOTE — Assessment & Plan Note (Signed)
Refilled epinephrine autoinjector.

## 2015-12-26 NOTE — Assessment & Plan Note (Addendum)
Unknown etiology. Unlikely due to medication given has been on for years. Could be residual nausea from recent viral gastroenteritis. Encouraged Zofran use which has worked in the past. Encouraged oral rehydration. Will get CBC, CMP, Lipase and Follow up in 2-3 weeks.

## 2015-12-26 NOTE — Patient Instructions (Signed)
Thank you for coming in today. You were seen for your nausea and headaches. 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 We will get some simple bloodwork to evaluate for other causes.  Your orthostatic vital signs were positive today, please increase your fluid intake.  Please come back in 2-3 weeks.

## 2015-12-26 NOTE — Progress Notes (Signed)
Felicia Cole is a 35 y.o. female who presents to [facility] today for nausea and headache.  Nausea: Patient has several week history of nausea, acutely exacerbated on Friday with a 16 hr history of diarrhea without vomiting. Patient has had success with phenergan though th IM shot burned and put her to sleep. She has not taken bentyl. Zofran works but she would not like to take it daily. Patient has been able to tolerate gatorade and does not think she needs IV fluids. She does note looser bowels for 2 months since starting graduate school, she thinks likely due to stress.   HA: Patient has had several week history of daily headaches. She wakes up okay then they start 1-2 hours later and persist all day. They feel like heaviness in her head and she feels like she needs to lie down. She endorses photophobia without phonophobia or vomiting. She has daily floaters but no other vision changes. She has a feeling of "wooziness" but no vertigo. When she lies down she is fine but symptoms are exacerbated when she sits up.    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 Patient has not had daily floaters but no change in visual fields. Patient had twins back in August and breastfed for one month. She has had a heavy menstrual flow since birthing the twins but no irregularity.One month ago, patient noted galactorrhea after getting out of the shower. Patient was ordered to get MRI every 3 years to follow this issue, though has not had one since 2008.   Patient has 65 month old twins and recently started graduate school, wondering if the nausea and headaches could be due to stress. Patient gets claustrophobic in MRI machine and would like to take valium as this was successful last time.    Past Medical History    Diagnosis Date  . Allergy   . Anxiety   . Asthma   . Herpesvirus 2   . Vitamin D deficiency   . Vaginal delivery 2013, 2016X2   Past Surgical History  Procedure Laterality Date  . Laparoscopic tubal ligation Bilateral 11/15/2015    Procedure: LAPAROSCOPIC TUBAL LIGATION;  Surgeon: Brien Few, MD;  Location: [facility];  Service: Gynecology;  Laterality: Bilateral;   Social History  Substance Use Topics  . Smoking status: Never Smoker   . Smokeless tobacco: Never Used  . Alcohol Use: No   family history includes ALS in her father; Anxiety disorder in her mother; Cancer in her maternal aunt, maternal grandmother, and paternal grandmother; Epilepsy in her father; Heart disease in her maternal grandfather, maternal grandmother, paternal grandfather, and paternal grandmother; Hypertension in her mother and paternal grandfather; Thyroid disease in her mother.  ROS as above Medications: Current Outpatient Prescriptions  Medication Sig Dispense Refill  . cetirizine (ZYRTEC) 10 MG tablet Take 10 mg by mouth daily.    . citalopram (CELEXA) 20 MG tablet Take 20 mg by mouth daily.    . diazepam (VALIUM) 5 MG tablet 1-2 tabs 60 mins prior to MRI. 4 tablet 0  . EPINEPHrine 0.3 mg/0.3 mL IJ SOAJ injection Inject 0.3 mLs (0.3 mg total) into the muscle once. 2 Device 1   No current facility-administered medications for this visit.   Allergies  Allergen Reactions  . Bee Venom Anaphylaxis    Patient carries epipen  . Shellfish  Allergy Anaphylaxis    Patient carries epipen     Exam:  BP 90/60 mmHg  Pulse 77  Wt 120 lb (54.432 kg)  Orthostatic VS for the past 24 hrs:  BP- Lying Pulse- Lying BP- Sitting Pulse- Sitting BP- Standing at 0 minutes Pulse- Standing at 0 minutes  12/26/15 0950 90/60 mmHg 77 99/62 mmHg 79 102/64 mmHg 97      Gen: Well appearing lady in NAD  HEENT: EOMI,  MMM. Optic disks clear.  Lungs: Normal work of breathing. CTABL Heart: RRR no MRG Abd: NABS, Soft.  Nondistended, Nontender no masses palpated. Exts: Brisk capillary refill, warm and well perfused.  Neuro:  CN 2-12 intact to detailed examination Gait normal   No results found for this or any previous visit (from the past 24 hour(s)). No results found.   Please see individual assessment and plan sections.

## 2015-12-26 NOTE — Assessment & Plan Note (Signed)
Prescribed valium for MRI.

## 2015-12-27 LAB — CBC
HCT: 36.2 % (ref 36.0–46.0)
Hemoglobin: 12.1 g/dL (ref 12.0–15.0)
MCH: 29.8 pg (ref 26.0–34.0)
MCHC: 33.4 g/dL (ref 30.0–36.0)
MCV: 89.2 fL (ref 78.0–100.0)
MPV: 10.1 fL (ref 8.6–12.4)
PLATELETS: 264 10*3/uL (ref 150–400)
RBC: 4.06 MIL/uL (ref 3.87–5.11)
RDW: 14.2 % (ref 11.5–15.5)
WBC: 5.7 10*3/uL (ref 4.0–10.5)

## 2015-12-27 NOTE — Progress Notes (Signed)
Quick Note:    Labs are normal.  ______

## 2016-01-08 ENCOUNTER — Ambulatory Visit (HOSPITAL_COMMUNITY): Payer: Managed Care, Other (non HMO)

## 2016-01-13 ENCOUNTER — Telehealth: Payer: Self-pay | Admitting: Family Medicine

## 2016-01-13 NOTE — Telephone Encounter (Signed)
Called patient regarding brain MRI results.

## 2016-01-16 ENCOUNTER — Encounter: Payer: Self-pay | Admitting: Family Medicine

## 2016-03-31 ENCOUNTER — Other Ambulatory Visit: Payer: Self-pay | Admitting: Physician Assistant

## 2016-05-19 ENCOUNTER — Telehealth: Payer: Self-pay | Admitting: *Deleted

## 2016-05-19 NOTE — Telephone Encounter (Signed)
I would agree! Not on med list -  She needs a visit for controlled substance. Thnks!

## 2016-05-19 NOTE — Telephone Encounter (Signed)
The patient is requesting a refill of xanax 0.5 mg tablet. Since this is not on her active med list and patient has not actually ever been evaluated and had a visit to discuss this, routing to provider for review. I think she needs appt

## 2016-05-20 NOTE — Telephone Encounter (Signed)
Patient was notified today.

## 2016-07-10 ENCOUNTER — Ambulatory Visit (INDEPENDENT_AMBULATORY_CARE_PROVIDER_SITE_OTHER): Payer: Managed Care, Other (non HMO) | Admitting: Osteopathic Medicine

## 2016-07-10 ENCOUNTER — Encounter: Payer: Self-pay | Admitting: Osteopathic Medicine

## 2016-07-10 VITALS — BP 90/64 | HR 82 | Ht 61.0 in | Wt 126.0 lb

## 2016-07-10 DIAGNOSIS — G4700 Insomnia, unspecified: Secondary | ICD-10-CM | POA: Diagnosis not present

## 2016-07-10 DIAGNOSIS — F411 Generalized anxiety disorder: Secondary | ICD-10-CM

## 2016-07-10 DIAGNOSIS — Z0189 Encounter for other specified special examinations: Secondary | ICD-10-CM | POA: Diagnosis not present

## 2016-07-10 DIAGNOSIS — Z008 Encounter for other general examination: Secondary | ICD-10-CM

## 2016-07-10 LAB — BASIC METABOLIC PANEL WITH GFR
BUN: 10 mg/dL (ref 7–25)
CALCIUM: 9.1 mg/dL (ref 8.6–10.2)
CO2: 28 mmol/L (ref 20–31)
Chloride: 104 mmol/L (ref 98–110)
Creat: 0.75 mg/dL (ref 0.50–1.10)
GFR, Est African American: >89 mL/min (ref >=60)
Glucose, Bld: 90 mg/dL (ref 65–99)
POTASSIUM: 3.9 mmol/L (ref 3.5–5.3)
SODIUM: 139 mmol/L (ref 135–146)

## 2016-07-10 LAB — LIPID PANEL
CHOL/HDL RATIO: 3 ratio (ref <=5.0)
Cholesterol: 137 mg/dL (ref 125–200)
HDL: 45 mg/dL — ABNORMAL LOW (ref >=46)
LDL Cholesterol: 73 mg/dL (ref <130)
Triglycerides: 94 mg/dL (ref <150)
VLDL: 19 mg/dL (ref <30)

## 2016-07-10 MED ORDER — ALPRAZOLAM 0.5 MG PO TABS: 0.5000 mg | ORAL_TABLET | Freq: Every evening | ORAL | 0 refills | Status: DC | PRN

## 2016-07-10 MED ORDER — ALPRAZOLAM 0.5 MG PO TABS: ORAL_TABLET | ORAL | 0 refills | Status: DC

## 2016-07-10 NOTE — Progress Notes (Signed)
HPI: Felicia Cole is a 35 y.o. female  who presents to [facility] today, 07/10/16,  for chief complaint of:  Chief Complaint  Patient presents with  . Follow-up    ANXIETY     Anxiety - previously on Xanax 0.5 prn daily - was taking this maybe once per week. Helps to fall asleep. Other Rx tried include Benadryl, Tylenol PM. Celexa is helpful for overall anxiety symptoms. I have not prescribed this medication for her in the past. Reports increased stress since being back in school, also taking care of her family.  She also brings paperwork to have biometric screening performed for employment/insurance purposes.  Past medical, surgical, social and family history reviewed: Past Medical History:  Diagnosis Date  . Allergy   . Anxiety   . Asthma   . Herpesvirus 2   . Vaginal delivery 2013, 2016X2  . Vitamin D deficiency    Past Surgical History:  Procedure Laterality Date  . LAPAROSCOPIC TUBAL LIGATION Bilateral 11/15/2015   Procedure: LAPAROSCOPIC TUBAL LIGATION;  Surgeon: Brien Few, MD;  Location: [facility];  Service: Gynecology;  Laterality: Bilateral;   Social History  Substance Use Topics  . Smoking status: Never Smoker  . Smokeless tobacco: Never Used  . Alcohol use No   Family History  Problem Relation Age of Onset  . Hypertension Mother   . Anxiety disorder Mother   . Thyroid disease Mother   . Epilepsy Father   . ALS Father   . Cancer Maternal Aunt     breast  . Heart disease Maternal Grandmother   . Cancer Maternal Grandmother     breast  . Heart disease Maternal Grandfather   . Cancer Paternal Grandmother     ovarian  . Heart disease Paternal Grandmother   . Heart disease Paternal Grandfather   . Hypertension Paternal Grandfather      Current medication list and allergy/intolerance information reviewed:   Current Outpatient Prescriptions  Medication Sig Dispense Refill  . cetirizine (ZYRTEC) 10 MG tablet Take 10  mg by mouth daily.    . citalopram (CELEXA) 20 MG tablet Take 20 mg by mouth daily.    . diazepam (VALIUM) 5 MG tablet 1-2 tabs 60 mins prior to MRI. 4 tablet 0  . EPINEPHrine 0.3 mg/0.3 mL IJ SOAJ injection Inject 0.3 mLs (0.3 mg total) into the muscle once. 2 Device 1   No current facility-administered medications for this visit.    Allergies  Allergen Reactions  . Bee Venom Anaphylaxis    Patient carries epipen  . Shellfish Allergy Anaphylaxis    Patient carries epipen      Review of Systems:  Constitutional:  No  fever, no chills, No recent illness, No unintentional weight changes. No significant fatigue.   HEENT: No  headache,  Cardiac: No  chest pain, No  pressure  Respiratory:  No  shortness of breath.  Endocrine: No cold intolerance,  No heat intolerance. No polyuria/polydipsia/polyphagia   Neurologic: No  weakness, No  dizziness  Psychiatric: No  concerns with depression, (+)concerns with anxiety, (+)sleep problems, No mood problems  Exam:  BP 90/64   Pulse 82   Ht 5\' 1"  (1.549 m)   Wt 126 lb (57.2 kg)   BMI 23.81 kg/m   Constitutional: VS see above. General Appearance: alert, well-developed, well-nourished, NAD  Eyes: Normal lids and conjunctive, non-icteric sclera  Ears, Nose, Mouth, Throat: MMM, Normal external inspection ears/nares/mouth/lips/gums.  Neck: No masses, trachea midline.  No thyroid enlargement. No tenderness/mass appreciated. No lymphadenopathy  Respiratory: Normal respiratory effort. no wheeze, no rhonchi, no rales  Cardiovascular: S1/S2 normal, no murmur, no rub/gallop auscultated. RRR.   Neurological: No cranial nerve deficit on limited exam. Motor and sensation intact and symmetric. Cerebellar reflexes grossly intact. Normal balance/coordination. No tremor.   Skin: warm, dry, intact. No rash/ulcer.   Psychiatric: Normal judgment/insight. Normal mood and affect. Oriented x3.    No results found for this or any previous visit (from  the past 72 hour(s)).  No results found.   ASSESSMENT/PLAN:   Since on Celexa, risk of QT prolongation with adding tricyclic antidepressant prn insomnia. Okay to continue very limited use of benzodiazepine as needed for significant anxiety/insomnia that this is not my first choice for this patient.  Of some concern, the controlled substances datbase was reviewed, I do not see any previous prescriptions for this patient for Xanax. She states that she thinks she has gotten it was in the past 2 years under her current name in [place]. Not sure why it wouldn't be showing up on the system, will get UDS today and consider controlled substance contract. Limited number of pills written. Consider increased his Celexa versus transition to alternative SSRI or other  Anxiety state - Plan: Drug Screen, Urine  Insomnia  Encounter for biometric screening - Plan: Lipid panel, BASIC METABOLIC PANEL WITH GFR     Visit summary with medication list and pertinent instructions was printed for patient to review. All questions at time of visit were answered - patient instructed to contact office with any additional concerns. ER/RTC precautions were reviewed with the patient. Follow-up plan: Return in about 3 months (around 10/10/2016), or sooner if needed, for INSOMNIA / ANXIETY .

## 2016-07-11 LAB — PAIN MGMT, PROFILE 6 CONF W/O MM, U
6 Acetylmorphine: NEGATIVE ng/mL (ref <10)
ALCOHOL METABOLITES: NEGATIVE ng/mL (ref <500)
AMPHETAMINES: NEGATIVE ng/mL (ref <500)
BARBITURATES: NEGATIVE ng/mL (ref <300)
BENZODIAZEPINES: NEGATIVE ng/mL (ref <100)
COCAINE METABOLITE: NEGATIVE ng/mL (ref <150)
CREATININE: 122.6 mg/dL (ref >=20.0)
MARIJUANA METABOLITE: NEGATIVE ng/mL (ref <20)
METHADONE METABOLITE: NEGATIVE ng/mL (ref <100)
OPIATES: NEGATIVE ng/mL (ref <100)
OXIDANT: NEGATIVE ug/mL (ref <200)
Oxycodone: NEGATIVE ng/mL (ref <100)
PH: 7.66 (ref 4.5–9.0)
PHENCYCLIDINE: NEGATIVE ng/mL (ref <25)
PLEASE NOTE: 0

## 2016-09-16 ENCOUNTER — Other Ambulatory Visit: Payer: Self-pay | Admitting: Osteopathic Medicine

## 2016-09-16 DIAGNOSIS — F411 Generalized anxiety disorder: Secondary | ICD-10-CM

## 2016-10-13 ENCOUNTER — Telehealth: Payer: Self-pay

## 2016-10-13 NOTE — Telephone Encounter (Signed)
Patient called and requested a refill for Celexa patient was advised that she needed a follow up appointment but she still requested a refill for at least a month. Please advise. Patient was getting this medication form a historical provider. Please advise if refill is appropriate. Rhonda Cunningham,CMA

## 2016-10-15 MED ORDER — CITALOPRAM HYDROBROMIDE 20 MG PO TABS: 20.0000 mg | ORAL_TABLET | Freq: Every day | ORAL | 3 refills | Status: DC

## 2016-10-15 NOTE — Telephone Encounter (Signed)
Ok to refill x 3 months??       

## 2016-12-02 ENCOUNTER — Ambulatory Visit (INDEPENDENT_AMBULATORY_CARE_PROVIDER_SITE_OTHER): Payer: Managed Care, Other (non HMO) | Admitting: Family Medicine

## 2016-12-02 ENCOUNTER — Ambulatory Visit (INDEPENDENT_AMBULATORY_CARE_PROVIDER_SITE_OTHER): Payer: Managed Care, Other (non HMO)

## 2016-12-02 ENCOUNTER — Encounter: Payer: Self-pay | Admitting: Family Medicine

## 2016-12-02 DIAGNOSIS — S300XXA Contusion of lower back and pelvis, initial encounter: Secondary | ICD-10-CM

## 2016-12-02 DIAGNOSIS — M533 Sacrococcygeal disorders, not elsewhere classified: Secondary | ICD-10-CM | POA: Diagnosis not present

## 2016-12-02 DIAGNOSIS — R197 Diarrhea, unspecified: Secondary | ICD-10-CM | POA: Diagnosis not present

## 2016-12-02 LAB — CBC
HCT: 37.9 % (ref 35.0–45.0)
Hemoglobin: 12.2 g/dL (ref 11.7–15.5)
MCH: 28.9 pg (ref 27.0–33.0)
MCHC: 32.2 g/dL (ref 32.0–36.0)
MCV: 89.8 fL (ref 80.0–100.0)
MPV: 10.1 fL (ref 7.5–12.5)
PLATELETS: 272 10*3/uL (ref 140–400)
RBC: 4.22 MIL/uL (ref 3.80–5.10)
RDW: 13.4 % (ref 11.0–15.0)
WBC: 6.7 10*3/uL (ref 3.8–10.8)

## 2016-12-02 MED ORDER — DIPHENOXYLATE-ATROPINE 2.5-0.025 MG PO TABS: ORAL_TABLET | ORAL | 3 refills | Status: DC

## 2016-12-02 NOTE — Patient Instructions (Signed)
Thank you for coming in today. Use lomotil as needed for diarrhea.  Work on eccentric exercises for your gluteus muscles.  Use ice and get the xray of your pelvis/sacrum today.   If your belly pain worsens, or you have high fever, bad vomiting, blood in your stool or black tarry stool go to the Emergency Room.    Diarrhea, Adult Diarrhea is frequent loose and watery bowel movements. Diarrhea can make you feel weak and cause you to become dehydrated. Dehydration can make you tired and thirsty, cause you to have a dry mouth, and decrease how often you urinate. Diarrhea typically lasts 2-3 days. However, it can last longer if it is a sign of something more serious. It is important to treat your diarrhea as told by your health care provider. Follow these instructions at home: Eating and drinking Follow these recommendations as told by your health care provider:  Take an oral rehydration solution (ORS). This is a drink that is sold at pharmacies and retail stores.  Drink clear fluids, such as water, ice chips, diluted fruit juice, and low-calorie sports drinks.  Eat bland, easy-to-digest foods in small amounts as you are able. These foods include bananas, applesauce, rice, lean meats, toast, and crackers.  Avoid drinking fluids that contain a lot of sugar or caffeine, such as energy drinks, sports drinks, and soda.  Avoid alcohol.  Avoid spicy or fatty foods. General instructions  Drink enough fluid to keep your urine clear or pale yellow.  Wash your hands often. If soap and water are not available, use hand sanitizer.  Make sure that all people in your household wash their hands well and often.  Take over-the-counter and prescription medicines only as told by your health care provider.  Rest at home while you recover.  Watch your condition for any changes.  Take a warm bath to relieve any burning or pain from frequent diarrhea episodes.  Keep all follow-up visits as told by your  health care provider. This is important. Contact a health care provider if:  You have a fever.  Your diarrhea gets worse.  You have new symptoms.  You cannot keep fluids down.  You feel light-headed or dizzy.  You have a headache  You have muscle cramps. Get help right away if:  You have chest pain.  You feel extremely weak or you faint.  You have bloody or black stools or stools that look like tar.  You have severe pain, cramping, or bloating in your abdomen.  You have trouble breathing or you are breathing very quickly.  Your heart is beating very quickly.  Your skin feels cold and clammy.  You feel confused.  You have signs of dehydration, such as:  Dark urine, very little urine, or no urine.  Cracked lips.  Dry mouth.  Sunken eyes.  Sleepiness.  Weakness. This information is not intended to replace advice given to you by your health care provider. Make sure you discuss any questions you have with your health care provider. Document Released: 11/06/2002 Document Revised: 03/26/2016 Document Reviewed: 07/23/2015 Elsevier Interactive Patient Education  2017 [employer].

## 2016-12-02 NOTE — Progress Notes (Signed)
Felicia Cole is a 36 y.o. female who presents to [facility] today for diarrhea and buttocks contusion.  Diarrhea: Patient has a two-week history of crampy nonbloody diarrhea. She denies significant abdominal pain fevers or chills. 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 She's tried some Imodium over-the-counter which helped a little.  Buttocks contusion: Patient was sledding with her children a few weeks ago when she hit a rock with her left buttocks. She had immediate pain and swelling. She notes continued pain and swelling only slightly improved after 4 weeks. She is unable to run because of the pain. She notes pain with regular activities as well.  She has tried some over-the-counter medicines for pain control which do help.    Past Medical History:  Diagnosis Date  . Allergy   . Anxiety   . Asthma   . Herpesvirus 2   . Vaginal delivery 2013, 2016X2  . Vitamin D deficiency    Past Surgical History:  Procedure Laterality Date  . LAPAROSCOPIC TUBAL LIGATION Bilateral 11/15/2015   Procedure: LAPAROSCOPIC TUBAL LIGATION;  Surgeon: Brien Few, MD;  Location: [facility];  Service: Gynecology;  Laterality: Bilateral;   Social History  Substance Use Topics  . Smoking status: Never Smoker  . Smokeless tobacco: Never Used  . Alcohol use No   family history includes ALS in her father; Anxiety disorder in her mother; Cancer in her maternal aunt, maternal grandmother, and paternal grandmother; Epilepsy in her father; Heart disease in her maternal grandfather, maternal grandmother, paternal grandfather, and paternal grandmother; Hypertension in her mother and paternal grandfather; Thyroid disease in her mother.  ROS as above:  Medications: Current Outpatient Prescriptions  Medication Sig Dispense Refill  . cetirizine (ZYRTEC) 10  MG tablet Take 10 mg by mouth daily.    . citalopram (CELEXA) 20 MG tablet Take 1 tablet (20 mg total) by mouth daily. 30 tablet 3  . EPINEPHrine 0.3 mg/0.3 mL IJ SOAJ injection Inject 0.3 mLs (0.3 mg total) into the muscle once. 2 Device 1  . MELATONIN PO Take by mouth.    . diphenoxylate-atropine (LOMOTIL) 2.5-0.025 MG tablet One to 2 tablets by mouth 4 times a day as needed for diarrhea. 30 tablet 3   No current facility-administered medications for this visit.    Allergies  Allergen Reactions  . Bee Venom Anaphylaxis    Patient carries epipen  . Shellfish Allergy Anaphylaxis    Patient carries epipen    Health Maintenance Health Maintenance  Topic Date Due  . INFLUENZA VACCINE  06/30/2016  . PAP SMEAR  11/30/2016  . TETANUS/TDAP  12/01/2023  . HIV Screening  Completed     Exam:  BP 99/67   Pulse 99   Temp 97.1 F (36.2 C)   Wt 126 lb (57.2 kg)   LMP 11/25/2016   SpO2 99%   BMI 23.81 kg/m  Gen: Well NAD HEENT: EOMI,  MMM Lungs: Normal work of breathing. CTABL Heart: RRR no MRG Abd: NABS, Soft. Nondistended, Nontender Exts: Brisk capillary refill, warm and well perfused.  MSK: She is nontender along the spinal midline. However she is tender at the sacrum on the left near the insertion of the gluteus muscle. She has normal muscular strength and motion but does experience pain with hip extension. There is a tender nodule in the same area.     No results found for this or any  previous visit (from the past 72 hour(s)). Dg Sacrum/coccyx  Result Date: 12/02/2016 CLINICAL DATA:  Status post sledding accident striking a rock with coccygeal region injury. Patient reports persistent pain for the past month. History of previous injury in the past while in high school. EXAM: SACRUM AND COCCYX - 2+ VIEW COMPARISON:  None in PACs. FINDINGS: The sacrum and coccyx are subjectively adequately mineralized. There are at least 3 intact sacral struts. The presacral soft tissues appear  normal. The sacrococcygeal junction also appears normal. IMPRESSION: There is no acute or significant chronic bony abnormality of the sacrum or coccyx. Electronically Signed   By: David  Martinique M.D.   On: 12/02/2016 16:44      Assessment and Plan: 36 y.o. female with  Diarrhea: Unclear etiology. Plan for limited workup listed below clean stool culture stool C. difficile and stool ova parasite exam. Treat empirically with Lomotil. Return if not improved.  Buttocks contusion: Treat with home physical therapy exercises focus on eccentric exercises as well as ice. No evidence of sacrum fracture or myositis ossificans on x-rays today   Orders Placed This Encounter  Procedures  . Stool culture  . Stool C-Diff Toxin Assay  . Ova and parasite examination  . DG Sacrum/Coccyx    Standing Status:   Future    Number of Occurrences:   1    Standing Expiration Date:   01/30/2018    Order Specific Question:   Reason for Exam (SYMPTOM  OR DIAGNOSIS REQUIRED)    Answer:   left sacurm contusoion following injury 1 month ago    Order Specific Question:   Is patient pregnant?    Answer:   No    Order Specific Question:   Preferred imaging location?    Answer:   [facility]  . CBC  . COMPLETE METABOLIC PANEL WITH GFR  . Lipase    Discussed warning signs or symptoms. Please see discharge instructions. Patient expresses understanding.

## 2016-12-03 ENCOUNTER — Other Ambulatory Visit: Payer: Self-pay | Admitting: Family Medicine

## 2016-12-03 LAB — COMPLETE METABOLIC PANEL WITH GFR
ALBUMIN: 4.2 g/dL (ref 3.6–5.1)
ALK PHOS: 58 U/L (ref 33–115)
ALT: 19 U/L (ref 6–29)
AST: 26 U/L (ref 10–30)
BILIRUBIN TOTAL: 0.4 mg/dL (ref 0.2–1.2)
BUN: 9 mg/dL (ref 7–25)
CO2: 29 mmol/L (ref 20–31)
CREATININE: 0.78 mg/dL (ref 0.50–1.10)
Calcium: 9.2 mg/dL (ref 8.6–10.2)
Chloride: 104 mmol/L (ref 98–110)
GFR, Est African American: >89 mL/min (ref >=60)
GFR, Est Non African American: >89 mL/min (ref >=60)
GLUCOSE: 82 mg/dL (ref 65–99)
Potassium: 3.7 mmol/L (ref 3.5–5.3)
SODIUM: 139 mmol/L (ref 135–146)
TOTAL PROTEIN: 6.8 g/dL (ref 6.1–8.1)

## 2016-12-03 LAB — LIPASE: Lipase: 36 U/L (ref 7–60)

## 2016-12-04 LAB — C. DIFFICILE GDH AND TOXIN A/B
C. DIFFICILE GDH: NOT DETECTED
C. difficile Toxin A/B: NOT DETECTED

## 2016-12-06 LAB — OVA AND PARASITE EXAMINATION: OP: NONE SEEN

## 2016-12-07 LAB — STOOL CULTURE

## 2017-04-13 HISTORY — PX: BREAST SURGERY: SHX581

## 2017-04-16 HISTORY — PX: OTHER SURGICAL HISTORY: SHX169

## 2017-04-19 ENCOUNTER — Other Ambulatory Visit: Payer: Self-pay | Admitting: Osteopathic Medicine

## 2017-05-14 ENCOUNTER — Other Ambulatory Visit: Payer: Self-pay | Admitting: Osteopathic Medicine

## 2017-05-14 ENCOUNTER — Encounter: Payer: Self-pay | Admitting: Osteopathic Medicine

## 2017-05-14 ENCOUNTER — Ambulatory Visit (INDEPENDENT_AMBULATORY_CARE_PROVIDER_SITE_OTHER): Payer: 59 | Admitting: Osteopathic Medicine

## 2017-05-14 VITALS — BP 106/71 | HR 76 | Ht 61.0 in | Wt 125.0 lb

## 2017-05-14 DIAGNOSIS — G4700 Insomnia, unspecified: Secondary | ICD-10-CM | POA: Diagnosis not present

## 2017-05-14 DIAGNOSIS — F411 Generalized anxiety disorder: Secondary | ICD-10-CM

## 2017-05-14 DIAGNOSIS — Z0000 Encounter for general adult medical examination without abnormal findings: Secondary | ICD-10-CM

## 2017-05-14 MED ORDER — SERTRALINE HCL 25 MG PO TABS: 25.0000 mg | ORAL_TABLET | Freq: Every day | ORAL | 1 refills | Status: DC

## 2017-05-14 NOTE — Patient Instructions (Signed)
Plan:   Switching from Celexa to Zoloft  Can switch directly or can half dose Celexa w/ the low dose Zoloft  Can increase Zoloft to 50 mg after a few weeks if doing well on the low dose, or can stay at the low dose
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
## 2017-06-08 ENCOUNTER — Telehealth: Payer: Self-pay | Admitting: Osteopathic Medicine

## 2017-06-08 DIAGNOSIS — G4700 Insomnia, unspecified: Secondary | ICD-10-CM

## 2017-06-08 DIAGNOSIS — F411 Generalized anxiety disorder: Secondary | ICD-10-CM

## 2017-06-08 MED ORDER — SERTRALINE HCL 50 MG PO TABS: 50.0000 mg | ORAL_TABLET | Freq: Every day | ORAL | 1 refills | Status: DC

## 2017-06-08 NOTE — Telephone Encounter (Signed)
Pt called clinic stating she increased her Zoloft to 50mg  daily (instead of 25mg  as Rx was written). Pt states she is tolerated this increase well. Request new Rx at 50mg  dosage. Will route to PCP for review.

## 2017-06-08 NOTE — Telephone Encounter (Signed)
Refill sent.

## 2017-06-08 NOTE — Telephone Encounter (Signed)
Left VM updating Pt of new Rx. Callback provided for any questions.

## 2017-11-16 ENCOUNTER — Other Ambulatory Visit: Payer: Self-pay | Admitting: Osteopathic Medicine

## 2017-11-16 DIAGNOSIS — G4700 Insomnia, unspecified: Secondary | ICD-10-CM

## 2017-11-16 DIAGNOSIS — F411 Generalized anxiety disorder: Secondary | ICD-10-CM

## 2018-06-05 ENCOUNTER — Other Ambulatory Visit: Payer: Self-pay | Admitting: Osteopathic Medicine

## 2018-06-05 DIAGNOSIS — F411 Generalized anxiety disorder: Secondary | ICD-10-CM

## 2018-06-05 DIAGNOSIS — G4700 Insomnia, unspecified: Secondary | ICD-10-CM

## 2018-06-06 ENCOUNTER — Other Ambulatory Visit: Payer: Self-pay | Admitting: Osteopathic Medicine

## 2018-06-06 DIAGNOSIS — F411 Generalized anxiety disorder: Secondary | ICD-10-CM

## 2018-06-06 DIAGNOSIS — G4700 Insomnia, unspecified: Secondary | ICD-10-CM

## 2018-06-07 ENCOUNTER — Other Ambulatory Visit: Payer: Self-pay | Admitting: Osteopathic Medicine

## 2018-06-07 DIAGNOSIS — G4700 Insomnia, unspecified: Secondary | ICD-10-CM

## 2018-06-07 DIAGNOSIS — F411 Generalized anxiety disorder: Secondary | ICD-10-CM

## 2018-07-21 ENCOUNTER — Ambulatory Visit (INDEPENDENT_AMBULATORY_CARE_PROVIDER_SITE_OTHER): Payer: 59 | Admitting: Osteopathic Medicine

## 2018-07-21 ENCOUNTER — Encounter: Payer: Self-pay | Admitting: Osteopathic Medicine

## 2018-07-21 VITALS — BP 89/59 | HR 72 | Temp 98.1°F | Wt 129.9 lb

## 2018-07-21 DIAGNOSIS — F418 Other specified anxiety disorders: Secondary | ICD-10-CM | POA: Diagnosis not present

## 2018-07-21 DIAGNOSIS — R350 Frequency of micturition: Secondary | ICD-10-CM

## 2018-07-21 LAB — POCT URINALYSIS DIPSTICK
BILIRUBIN UA: NEGATIVE
Blood, UA: NEGATIVE
Glucose, UA: NEGATIVE
Ketones, UA: NEGATIVE
LEUKOCYTES UA: NEGATIVE
Nitrite, UA: NEGATIVE
Protein, UA: NEGATIVE
Spec Grav, UA: >=1.03 — AB (ref 1.010–1.025)
Urobilinogen, UA: 0.2 E.U./dL
pH, UA: 5.5 (ref 5.0–8.0)

## 2018-07-21 MED ORDER — BUPROPION HCL ER (XL) 150 MG PO TB24: 150.0000 mg | ORAL_TABLET | ORAL | 0 refills | Status: DC

## 2018-07-21 NOTE — Patient Instructions (Addendum)
   Will trial wellbutrin   If this isn't helping after a few weeks, can icnrease dose or can consider alternative. Call / message the office and let me know how your'e doing   UA looks ok in the office, will send for culture and treat if needed

## 2018-07-21 NOTE — Progress Notes (Signed)
HPI: Felicia Cole is a 37 y.o. female who  has a past medical history of Allergy, Anxiety, Asthma, Herpesvirus 2, Vaginal delivery (2013, 2016X2), and Vitamin D deficiency.  she presents to [facility] today, 07/21/18,  for chief complaint of:  UTI Anxiety  ?UTI: Frequency, itching, 3 weeks urinary problems.   Anxiety/Depression: would like to discuss medications. She is not taking zoloft.  Previously on citalopram, alprazolam, sertaline. Has been off sertaline for few weeks now. Felt better on SSRI than on nothing. Would like to try wellbutrin or consider newer brand Rx. Symptoms - irritable, trouble getting to sleep, concentration problems, worrying        Past medical history, surgical history, and family history reviewed.  Current medication list and allergy/intolerance information reviewed.   (See remainder of HPI, ROS, Phys Exam below)  Results for orders placed or performed in visit on 07/21/18 (from the past 24 hour(s))  POCT Urinalysis Dipstick     Status: Abnormal   Collection Time: 07/21/18 11:36 AM  Result Value Ref Range   Color, UA YELLOW    Clarity, UA CLEAR    Glucose, UA Negative Negative   Bilirubin, UA Negative    Ketones, UA Negative    Spec Grav, UA >=1.030 (A) 1.010 - 1.025   Blood, UA Negative    pH, UA 5.5 5.0 - 8.0   Protein, UA Negative Negative   Urobilinogen, UA 0.2 0.2 or 1.0 E.U./dL   Nitrite, UA Negative    Leukocytes, UA Negative Negative   Appearance     Odor        ASSESSMENT/PLAN:   Anxiety with depression  Urinary frequency - Plan: POCT Urinalysis Dipstick, Urine Culture, Urinalysis, microscopic only   Meds ordered this encounter  Medications  . buPROPion (WELLBUTRIN XL) 150 MG 24 hr tablet    Sig: Take 1 tablet (150 mg total) by mouth every morning.    Dispense:  90 tablet    Refill:  0    Patient Instructions   Will trial wellbutrin   If this isn't helping after a few weeks, can  icnrease dose or can consider alternative. Call / message the office and let me know how your'e doing   UA looks ok in the office, will send for culture and treat if needed    Follow-up plan: Return if symptoms worsen or fail to improve.                                ############################################ ############################################ ############################################ ############################################    Outpatient Encounter Medications as of 07/21/2018  Medication Sig  . cetirizine (ZYRTEC) 10 MG tablet Take 10 mg by mouth daily.  . diphenoxylate-atropine (LOMOTIL) 2.5-0.025 MG tablet One to 2 tablets by mouth 4 times a day as needed for diarrhea.  Marland Kitchen EPINEPHrine 0.3 mg/0.3 mL IJ SOAJ injection Inject 0.3 mLs (0.3 mg total) into the muscle once.  Marland Kitchen MELATONIN PO Take by mouth.  . sertraline (ZOLOFT) 50 MG tablet Take 1 tablet (50 mg total) by mouth daily. Must schedule OV for further refills   No facility-administered encounter medications on file as of 07/21/2018.    Allergies  Allergen Reactions  . Bee Venom Anaphylaxis    Patient carries epipen  . Shellfish Allergy Anaphylaxis    Patient carries epipen      Review of Systems:  Constitutional: No recent illness  HEENT: No  headache,  no vision change  Cardiac: No  chest pain, No  pressure, No palpitations  Respiratory:  No  shortness of breath. No  Cough  Gastrointestinal: No  abdominal pain, no change on bowel habits  Musculoskeletal: No new myalgia/arthralgia  Neurologic: No  weakness, No  Dizziness  Psychiatric: No  concerns with depression, +concerns with anxiety  GAD 7 : Generalized Anxiety Score 07/21/2018 07/10/2016  Nervous, Anxious, on Edge 2 3  Control/stop worrying 0 1  Worry too much - different things 1 2  Trouble relaxing 2 3  Restless 1 0  Easily annoyed or irritable 2 1  Afraid - awful might happen 0 1  Total GAD 7 Score 8  11  Anxiety Difficulty Somewhat difficult Not difficult at all    Depression screen [facility] 2/9 07/21/2018 05/14/2017  Decreased Interest 0 0  Down, Depressed, Hopeless 1 0  PHQ - 2 Score 1 0  Altered sleeping 2 0  Tired, decreased energy 2 0  Change in appetite 2 0  Feeling bad or failure about yourself  0 0  Trouble concentrating 0 0  Moving slowly or fidgety/restless 0 0  Suicidal thoughts 0 0  PHQ-9 Score 7 0  Difficult doing work/chores Somewhat difficult -    Exam:  BP (!) 89/59 (BP Location: Left Arm, Patient Position: Sitting, Cuff Size: Normal)   Pulse 72   Temp 98.1 F (36.7 C) (Oral)   Wt 129 lb 14.4 oz (58.9 kg)   BMI 24.54 kg/m   Constitutional: VS see above. General Appearance: alert, well-developed, well-nourished, NAD  Eyes: Normal lids and conjunctive, non-icteric sclera  Ears, Nose, Mouth, Throat: MMM, Normal external inspection ears/nares/mouth/lips/gums.  Neck: No masses, trachea midline.   Respiratory: Normal respiratory effort.   Musculoskeletal: Gait normal. Symmetric and independent movement of all extremities  Neurological: Normal balance/coordination. No tremor.  Skin: warm, dry, intact.   Psychiatric: Normal judgment/insight. Normal mood and affect. Oriented x3.   Visit summary with medication list and pertinent instructions was printed for patient to review, advised to alert [place] if any changes needed. All questions at time of visit were answered - patient instructed to contact office with any additional concerns. ER/RTC precautions were reviewed with the patient and understanding verbalized.   Follow-up plan: No follow-ups on file.     Please note: voice recognition software was used to produce this document, and typos may escape review. Please contact Dr. Sheppard Coil for any needed clarifications.

## 2018-07-22 LAB — URINE CULTURE
MICRO NUMBER:: 91003467
RESULT: NO GROWTH
SPECIMEN QUALITY:: ADEQUATE

## 2018-07-22 LAB — URINALYSIS, MICROSCOPIC ONLY

## 2018-08-23 ENCOUNTER — Telehealth: Payer: Self-pay

## 2018-08-23 NOTE — Telephone Encounter (Signed)
Pt called stating she no longer wants to take Bupropion med. As per pt, med is not working. Requesting a rx for Effexor to be sent to [facility]. Thanks.

## 2018-08-24 MED ORDER — VENLAFAXINE HCL ER 75 MG PO CP24: 75.0000 mg | ORAL_CAPSULE | Freq: Every day | ORAL | 2 refills | Status: DC

## 2018-08-24 NOTE — Telephone Encounter (Signed)
Left pt a detailed vm msg regarding new med rx sent to local pharmacy. Call back info provided.

## 2018-08-24 NOTE — Telephone Encounter (Signed)
Extended release Effexor sent to pharmacy.  This should replace bupropion and sertraline. Follow-up with PCP in the near future.

## 2018-11-16 ENCOUNTER — Other Ambulatory Visit: Payer: Self-pay | Admitting: Family Medicine

## 2019-02-17 LAB — HM PAP SMEAR

## 2019-02-21 ENCOUNTER — Other Ambulatory Visit: Payer: Self-pay

## 2019-02-21 MED ORDER — VENLAFAXINE HCL ER 75 MG PO CP24: ORAL_CAPSULE | ORAL | 0 refills | Status: DC

## 2019-02-22 ENCOUNTER — Other Ambulatory Visit: Payer: Self-pay | Admitting: Urology

## 2019-02-22 DIAGNOSIS — N361 Urethral diverticulum: Secondary | ICD-10-CM

## 2019-03-24 ENCOUNTER — Other Ambulatory Visit: Payer: Self-pay | Admitting: Osteopathic Medicine

## 2019-03-27 ENCOUNTER — Telehealth: Payer: Self-pay | Admitting: Osteopathic Medicine

## 2019-03-27 NOTE — Telephone Encounter (Signed)
Patient would like to know id she can get a refill on her venlafaxine XR (EFFEXOR-XR) 75 MG 24 hr capsule [[id number]] sent to [facility] on [address], [place], [place] [address]. Patient was last seen in office on 07/21/18 and states that she only has 2 pills left. She will make an appointment once all this blows over in the summer. Did offer virtual visit and or telephone but patient wanted to know if this appointment was absolutely needed. Please contact and advise.

## 2019-03-27 NOTE — Telephone Encounter (Signed)
Pt needs OV, routing to PCP to see her thoughts on Rx refill

## 2019-03-29 MED ORDER — VENLAFAXINE HCL ER 75 MG PO CP24: ORAL_CAPSULE | ORAL | 0 refills | Status: DC

## 2019-03-29 NOTE — Telephone Encounter (Signed)
Pt advised.

## 2019-03-29 NOTE — Telephone Encounter (Signed)
Sent 90 pills Needs annual physical sometime in the next couple months!

## 2019-04-21 ENCOUNTER — Other Ambulatory Visit: Payer: 59

## 2019-05-09 ENCOUNTER — Encounter: Payer: Self-pay | Admitting: Osteopathic Medicine

## 2019-05-09 ENCOUNTER — Ambulatory Visit: Payer: 59 | Admitting: Osteopathic Medicine

## 2019-05-09 VITALS — BP 98/62 | HR 104 | Temp 98.2°F | Wt 137.0 lb

## 2019-05-09 DIAGNOSIS — Z0000 Encounter for general adult medical examination without abnormal findings: Secondary | ICD-10-CM

## 2019-05-09 DIAGNOSIS — Z86018 Personal history of other benign neoplasm: Secondary | ICD-10-CM | POA: Diagnosis not present

## 2019-05-09 DIAGNOSIS — E559 Vitamin D deficiency, unspecified: Secondary | ICD-10-CM | POA: Diagnosis not present

## 2019-05-09 MED ORDER — VENLAFAXINE HCL ER 75 MG PO CP24: 75.0000 mg | ORAL_CAPSULE | Freq: Every day | ORAL | 3 refills | Status: DC

## 2019-05-09 NOTE — Patient Instructions (Addendum)
General Preventive Care  Most recent routine screening lipids/other labs: ordered today.   Everyone should have blood pressure checked once per year.   Tobacco: don't!   Alcohol: responsible moderation is ok for most adults - if you have concerns about your alcohol intake, please talk to me!   Exercise: as tolerated to reduce risk of cardiovascular disease and diabetes. Strength training will also prevent osteoporosis.   Mental health: if need for mental health care (medicines, counseling, other), or concerns about moods, please let me know!   Sexual health: if need for STD testing, or if concerns with libido/pain problems, please let me know!   Advanced Directive: Living Will and/or Healthcare Power of Attorney recommended for all adults, regardless of age or health.  Vaccines  Flu vaccine: recommended for almost everyone, every fall.   Shingles vaccine: Shingrix recommended after age 68.   Pneumonia vaccines: Prevnar and Pneumovax recommended after age 82, or sooner if certain medical conditions.  Tetanus booster: Tdap recommended every 10 years.  Cancer screenings   Colon cancer screening: recommended for everyone at age 48  Breast cancer screening: mammogram recommended starting at age 81  Cervical cancer screening: Pap every 1 to 5 years depending on age and other risk factors. Can usually stop at age 10 or w/ hysterectomy.   Lung cancer screening: not needed for non-smokers  Infection screenings . HIV, Gonorrhea/Chlamydia: screening as needed. . Hepatitis C: recommended for anyone born 49-1965 . TB: certain at-risk populations, or depending on work requirements and/or travel history Other . Bone Density Test: recommended for women at age 4

## 2019-05-09 NOTE — Progress Notes (Signed)
HPI: Felicia Cole is a 38 y.o. female who  has a past medical history of Allergy, Anxiety, Asthma, Herpesvirus 2, Vaginal delivery (2013, 2016X2), and Vitamin D deficiency.  she presents to [facility] today, 05/09/19,  for chief complaint of: Annual physical  Medication refill     Patient here for annual physical / wellness exam.  See preventive care reviewed as below.    Additional concerns today include: nothing major, some weight gain, would like TSH checked. SHe's doing well, graduated w/ F[employer] degre this spring.     Past medical, surgical, social and family history reviewed:  Patient Active Problem List   Diagnosis Date Noted  . Diarrhea 12/02/2016  . Contusion of pelvis 12/02/2016  . 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 History of anaphylaxis 12/26/2015  . Nausea without vomiting 12/26/2015  . Twins 07/22/2015  . Postpartum care following twin vaginal delivery (8/22) 07/22/2015  . Allergy   . Anxiety   . Asthma   . Herpesvirus 2   . Vitamin D deficiency     Past Surgical History:  Procedure Laterality Date  . BREAST SURGERY  04/13/2017   augmentation   . LAPAROSCOPIC TUBAL LIGATION Bilateral 11/15/2015   Procedure: LAPAROSCOPIC TUBAL LIGATION;  Surgeon: Brien Few, MD;  Location: [facility];  Service: Gynecology;  Laterality: Bilateral;  . tummy tuck  04/16/2017    Social History   Tobacco Use  . Smoking status: Never Smoker  . Smokeless tobacco: Never Used  Substance Use Topics  . Alcohol use: No    Family History  Problem Relation Age of Onset  . Hypertension Mother   . Anxiety disorder Mother   . Thyroid disease Mother   . Epilepsy Father   . ALS Father   . Cancer Maternal Aunt        breast  . Heart disease Maternal Grandmother   . Cancer Maternal Grandmother        breast  . Heart disease Maternal Grandfather   . Cancer Paternal Grandmother        ovarian  . Heart disease Paternal Grandmother   . Heart  disease Paternal Grandfather   . Hypertension Paternal Grandfather      Current medication list and allergy/intolerance information reviewed:    Current Outpatient Medications  Medication Sig Dispense Refill  . cetirizine (ZYRTEC) 10 MG tablet Take 10 mg by mouth daily.    Marland Kitchen EPINEPHrine 0.3 mg/0.3 mL IJ SOAJ injection Inject 0.3 mLs (0.3 mg total) into the muscle once. 2 Device 1  . MELATONIN PO Take by mouth.    . venlafaxine XR (EFFEXOR-XR) 75 MG 24 hr capsule TAKE 1 CAPSULE(75 MG) BY MOUTH DAILY WITH BREAKFAST. CALL FOR FOLLOW UP VISIT 90 capsule 0  . diphenoxylate-atropine (LOMOTIL) 2.5-0.025 MG tablet One to 2 tablets by mouth 4 times a day as needed for diarrhea. (Patient not taking: Reported on 05/09/2019) 30 tablet 3   No current facility-administered medications for this visit.     Allergies  Allergen Reactions  . Bee Venom Anaphylaxis    Patient carries epipen  . Shellfish Allergy Anaphylaxis    Patient carries epipen      Review of Systems:  Constitutional:  No  fever, no chills, No recent illness, No unintentional weight changes. No significant fatigue.   HEENT: No  headache, no vision change, no hearing change, No sore throat, No  sinus pressure  Cardiac: No  chest pain, No  pressure, No palpitations, No  Orthopnea  Respiratory:  No  shortness of breath. No  Cough  Gastrointestinal: No  abdominal pain, No  nausea, No  vomiting,  No  blood in stool, No  diarrhea, No  constipation   Musculoskeletal: No new myalgia/arthralgia  Skin: No  Rash, No other wounds/concerning lesions  Genitourinary: No  incontinence, No  abnormal genital bleeding, No abnormal genital discharge  Hem/Onc: No  easy bruising/bleeding, No  abnormal lymph node  Endocrine: No cold intolerance,  No heat intolerance. No polyuria/polydipsia/polyphagia   Neurologic: No  weakness, No  dizziness, No  slurred speech/focal weakness/facial droop  Psychiatric: No  concerns with depression, No   concerns with anxiety, No sleep problems, No mood problems  Exam:  BP 98/62 (BP Location: Left Arm, Patient Position: Sitting, Cuff Size: Normal)   Pulse (!) 104   Temp 98.2 F (36.8 C) (Oral)   Wt 137 lb (62.1 kg)   BMI 25.89 kg/m   Constitutional: VS see above. General Appearance: alert, well-developed, well-nourished, NAD  Eyes: Normal lids and conjunctive, non-icteric sclera  Ears, Nose, Mouth, Throat: MMM, Normal external inspection ears/nares/mouth/lips/gums. TM normal bilaterally. Pharynx/tonsils no erythema, no exudate. Nasal mucosa normal.   Neck: No masses, trachea midline. No thyroid enlargement. No tenderness/mass appreciated. No lymphadenopathy  Respiratory: Normal respiratory effort. no wheeze, no rhonchi, no rales  Cardiovascular: S1/S2 normal, no murmur, no rub/gallop auscultated. RRR. No lower extremity edema. Pedal pulse II/IV bilaterally DP and PT. No carotid bruit or JVD. No abdominal aortic bruit.  Gastrointestinal: Nontender, no masses. No hepatomegaly, no splenomegaly. No hernia appreciated. Bowel sounds normal. Rectal exam deferred.   Musculoskeletal: Gait normal. No clubbing/cyanosis of digits.   Neurological: Normal balance/coordination. No tremor. No cranial nerve deficit on limited exam. Motor and sensation intact and symmetric. Cerebellar reflexes intact.   Skin: warm, dry, intact. No rash/ulcer. No concerning nevi or subq nodules on limited exam.    Psychiatric: Normal judgment/insight. Normal mood and affect. Oriented x3.      ASSESSMENT/PLAN: The primary encounter diagnosis was Annual physical exam. Diagnoses of Vitamin D deficiency and History of prolactinoma were also pertinent to this visit.   Orders Placed This Encounter  Procedures  . CBC  . COMPLETE METABOLIC PANEL WITH GFR  . Lipid panel  . VITAMIN D 25 Hydroxy (Vit-D Deficiency, Fractures)  . Prolactin  . TSH    No orders of the defined types were placed in this  encounter.   Patient Instructions  General Preventive Care  Most recent routine screening lipids/other labs: ordered today.   Everyone should have blood pressure checked once per year.   Tobacco: don't!   Alcohol: responsible moderation is ok for most adults - if you have concerns about your alcohol intake, please talk to me!   Exercise: as tolerated to reduce risk of cardiovascular disease and diabetes. Strength training will also prevent osteoporosis.   Mental health: if need for mental health care (medicines, counseling, other), or concerns about moods, please let me know!   Sexual health: if need for STD testing, or if concerns with libido/pain problems, please let me know!   Advanced Directive: Living Will and/or Healthcare Power of Attorney recommended for all adults, regardless of age or health.  Vaccines  Flu vaccine: recommended for almost everyone, every fall.   Shingles vaccine: Shingrix recommended after age 93.   Pneumonia vaccines: Prevnar and Pneumovax recommended after age 99, or sooner if certain medical conditions.  Tetanus booster: Tdap recommended every 10 years.  Cancer screenings  Colon cancer screening: recommended for everyone at age 77  Breast cancer screening: mammogram recommended starting at age 68  Cervical cancer screening: Pap every 1 to 5 years depending on age and other risk factors. Can usually stop at age 16 or w/ hysterectomy.   Lung cancer screening: not needed for non-smokers  Infection screenings . HIV, Gonorrhea/Chlamydia: screening as needed. . Hepatitis C: recommended for anyone born [birth year]-1965 . TB: certain at-risk populations, or depending on work requirements and/or travel history Other . Bone Density Test: recommended for women at age 22       Visit summary with medication list and pertinent instructions was printed for patient to review. All questions at time of visit were answered - patient instructed to contact office  with any additional concerns or updates. ER/RTC precautions were reviewed with the patient.     Please note: voice recognition software was used to produce this document, and typos may escape review. Please contact Dr. Sheppard Coil for any needed clarifications.     Follow-up plan: Return in about 1 year (around 05/08/2020) for annual physical, sooner if needed .

## 2019-05-10 ENCOUNTER — Other Ambulatory Visit: Payer: Self-pay | Admitting: Urology

## 2019-05-12 ENCOUNTER — Ambulatory Visit
Admission: RE | Admit: 2019-05-12 | Discharge: 2019-05-12 | Disposition: A | Discharge status: Home / Self Care | Payer: 59 | Source: Ambulatory Visit | Attending: Urology | Admitting: Urology

## 2019-05-12 ENCOUNTER — Other Ambulatory Visit: Payer: Self-pay

## 2019-05-12 DIAGNOSIS — N361 Urethral diverticulum: Secondary | ICD-10-CM

## 2019-05-12 LAB — COMPLETE METABOLIC PANEL WITH GFR
AG Ratio: 1.6 (calc) (ref 1.0–2.5)
ALT: 14 U/L (ref 6–29)
AST: 20 U/L (ref 10–30)
Albumin: 4.4 g/dL (ref 3.6–5.1)
Alkaline phosphatase (APISO): 54 U/L (ref 31–125)
BUN: 9 mg/dL (ref 7–25)
CO2: 26 mmol/L (ref 20–32)
Calcium: 9.4 mg/dL (ref 8.6–10.2)
Chloride: 102 mmol/L (ref 98–110)
Creat: 0.83 mg/dL (ref 0.50–1.10)
GFR, Est African American: 104 mL/min/{1.73_m2} (ref >=60)
GFR, Est Non African American: 90 mL/min/{1.73_m2} (ref >=60)
Globulin: 2.7 g/dL (calc) (ref 1.9–3.7)
Glucose, Bld: 84 mg/dL (ref 65–99)
Potassium: 4.1 mmol/L (ref 3.5–5.3)
Sodium: 138 mmol/L (ref 135–146)
Total Bilirubin: 0.5 mg/dL (ref 0.2–1.2)
Total Protein: 7.1 g/dL (ref 6.1–8.1)

## 2019-05-12 LAB — LIPID PANEL
Cholesterol: 164 mg/dL (ref <200)
HDL: 46 mg/dL — ABNORMAL LOW (ref >=50)
LDL Cholesterol (Calc): 97 mg/dL (calc)
Non-HDL Cholesterol (Calc): 118 mg/dL (calc) (ref <130)
Total CHOL/HDL Ratio: 3.6 (calc) (ref <5.0)
Triglycerides: 109 mg/dL (ref <150)

## 2019-05-12 LAB — CBC
HCT: 37.6 % (ref 35.0–45.0)
Hemoglobin: 12.8 g/dL (ref 11.7–15.5)
MCH: 30.7 pg (ref 27.0–33.0)
MCHC: 34 g/dL (ref 32.0–36.0)
MCV: 90.2 fL (ref 80.0–100.0)
MPV: 10.5 fL (ref 7.5–12.5)
Platelets: 252 10*3/uL (ref 140–400)
RBC: 4.17 10*6/uL (ref 3.80–5.10)
RDW: 12.3 % (ref 11.0–15.0)
WBC: 5.7 10*3/uL (ref 3.8–10.8)

## 2019-05-12 LAB — VITAMIN D 25 HYDROXY (VIT D DEFICIENCY, FRACTURES): Vit D, 25-Hydroxy: 35 ng/mL (ref 30–100)

## 2019-05-12 LAB — TSH: TSH: 0.75 mIU/L

## 2019-05-12 LAB — PROLACTIN: Prolactin: 5.8 ng/mL

## 2019-05-12 MED ORDER — GADOBENATE DIMEGLUMINE 529 MG/ML IV SOLN
12.0000 mL | Freq: Once | INTRAVENOUS | Status: AC | PRN
  Administered 2019-05-12: 12 mL via INTRAVENOUS

## 2020-03-20 ENCOUNTER — Encounter: Payer: Self-pay | Admitting: Osteopathic Medicine

## 2022-04-30 ENCOUNTER — Emergency Department (HOSPITAL_BASED_OUTPATIENT_CLINIC_OR_DEPARTMENT_OTHER): Payer: No Typology Code available for payment source

## 2022-04-30 ENCOUNTER — Other Ambulatory Visit: Payer: Self-pay

## 2022-04-30 ENCOUNTER — Encounter (HOSPITAL_BASED_OUTPATIENT_CLINIC_OR_DEPARTMENT_OTHER): Payer: Self-pay | Admitting: Obstetrics and Gynecology

## 2022-04-30 ENCOUNTER — Observation Stay (HOSPITAL_BASED_OUTPATIENT_CLINIC_OR_DEPARTMENT_OTHER)
Admission: EM | Admit: 2022-04-30 | Discharge: 2022-05-01 | Disposition: A | Discharge status: Home / Self Care | Payer: No Typology Code available for payment source | Attending: General Surgery | Admitting: General Surgery

## 2022-04-30 DIAGNOSIS — R1010 Upper abdominal pain, unspecified: Secondary | ICD-10-CM

## 2022-04-30 DIAGNOSIS — R7989 Other specified abnormal findings of blood chemistry: Secondary | ICD-10-CM | POA: Insufficient documentation

## 2022-04-30 DIAGNOSIS — K8020 Calculus of gallbladder without cholecystitis without obstruction: Secondary | ICD-10-CM | POA: Diagnosis present

## 2022-04-30 DIAGNOSIS — K8000 Calculus of gallbladder with acute cholecystitis without obstruction: Secondary | ICD-10-CM | POA: Diagnosis present

## 2022-04-30 DIAGNOSIS — K8064 Calculus of gallbladder and bile duct with chronic cholecystitis without obstruction: Principal | ICD-10-CM | POA: Insufficient documentation

## 2022-04-30 DIAGNOSIS — J45909 Unspecified asthma, uncomplicated: Secondary | ICD-10-CM | POA: Diagnosis not present

## 2022-04-30 LAB — COMPREHENSIVE METABOLIC PANEL
ALT: 368 U/L — ABNORMAL HIGH (ref 0–44)
AST: 625 U/L — ABNORMAL HIGH (ref 15–41)
Albumin: 4.4 g/dL (ref 3.5–5.0)
Alkaline Phosphatase: 94 U/L (ref 38–126)
Anion gap: 11 (ref 5–15)
BUN: 14 mg/dL (ref 6–20)
CO2: 23 mmol/L (ref 22–32)
Calcium: 9.4 mg/dL (ref 8.9–10.3)
Chloride: 103 mmol/L (ref 98–111)
Creatinine, Ser: 0.76 mg/dL (ref 0.44–1.00)
GFR, Estimated: >60 mL/min (ref >60)
Glucose, Bld: 121 mg/dL — ABNORMAL HIGH (ref 70–99)
Potassium: 3.9 mmol/L (ref 3.5–5.1)
Sodium: 137 mmol/L (ref 135–145)
Total Bilirubin: 1.5 mg/dL — ABNORMAL HIGH (ref 0.3–1.2)
Total Protein: 7.4 g/dL (ref 6.5–8.1)

## 2022-04-30 LAB — CBC WITH DIFFERENTIAL/PLATELET
Abs Immature Granulocytes: 0.03 10*3/uL (ref 0.00–0.07)
Basophils Absolute: 0 10*3/uL (ref 0.0–0.1)
Basophils Relative: 0 %
Eosinophils Absolute: 0 10*3/uL (ref 0.0–0.5)
Eosinophils Relative: 0 %
HCT: 36.1 % (ref 36.0–46.0)
Hemoglobin: 12 g/dL (ref 12.0–15.0)
Immature Granulocytes: 0 %
Lymphocytes Relative: 9 %
Lymphs Abs: 1 10*3/uL (ref 0.7–4.0)
MCH: 30.2 pg (ref 26.0–34.0)
MCHC: 33.2 g/dL (ref 30.0–36.0)
MCV: 90.7 fL (ref 80.0–100.0)
Monocytes Absolute: 0.6 10*3/uL (ref 0.1–1.0)
Monocytes Relative: 6 %
Neutro Abs: 9.1 10*3/uL — ABNORMAL HIGH (ref 1.7–7.7)
Neutrophils Relative %: 85 %
Platelets: 231 10*3/uL (ref 150–400)
RBC: 3.98 MIL/uL (ref 3.87–5.11)
RDW: 13.1 % (ref 11.5–15.5)
WBC: 10.8 10*3/uL — ABNORMAL HIGH (ref 4.0–10.5)
nRBC: 0 % (ref 0.0–0.2)

## 2022-04-30 LAB — TROPONIN I (HIGH SENSITIVITY): Troponin I (High Sensitivity): 3 ng/L (ref <18)

## 2022-04-30 LAB — LIPASE, BLOOD: Lipase: 30 U/L (ref 11–51)

## 2022-04-30 MED ORDER — MORPHINE SULFATE (PF) 2 MG/ML IV SOLN
2.0000 mg | Freq: Once | INTRAVENOUS | Status: AC
  Administered 2022-04-30: 2 mg via INTRAVENOUS
  Filled 2022-04-30: qty 1

## 2022-04-30 MED ORDER — LIDOCAINE VISCOUS HCL 2 % MT SOLN
15.0000 mL | Freq: Once | OROMUCOSAL | Status: AC
  Administered 2022-04-30: 15 mL via ORAL
  Filled 2022-04-30: qty 15

## 2022-04-30 MED ORDER — MORPHINE SULFATE (PF) 4 MG/ML IV SOLN
4.0000 mg | Freq: Once | INTRAVENOUS | Status: DC
  Filled 2022-04-30: qty 1

## 2022-04-30 MED ORDER — ONDANSETRON HCL 4 MG/2ML IJ SOLN
4.0000 mg | Freq: Once | INTRAMUSCULAR | Status: AC
  Administered 2022-04-30: 4 mg via INTRAVENOUS
  Filled 2022-04-30: qty 2

## 2022-04-30 MED ORDER — PIPERACILLIN-TAZOBACTAM 3.375 G IVPB
3.3750 g | Freq: Three times a day (TID) | INTRAVENOUS | Status: DC
  Administered 2022-05-01 (×2): 3.375 g via INTRAVENOUS
  Filled 2022-04-30 (×2): qty 50

## 2022-04-30 MED ORDER — ONDANSETRON HCL 4 MG/2ML IJ SOLN: 4.0000 mg | Freq: Three times a day (TID) | INTRAMUSCULAR | Status: DC | PRN

## 2022-04-30 MED ORDER — HYDROMORPHONE HCL 1 MG/ML IJ SOLN
0.5000 mg | INTRAMUSCULAR | Status: DC | PRN
  Administered 2022-04-30 – 2022-05-01 (×2): 0.5 mg via INTRAVENOUS
  Filled 2022-04-30 (×2): qty 1

## 2022-04-30 MED ORDER — PIPERACILLIN-TAZOBACTAM 3.375 G IVPB 30 MIN
3.3750 g | Freq: Once | INTRAVENOUS | Status: AC
  Administered 2022-04-30: 3.375 g via INTRAVENOUS
  Filled 2022-04-30: qty 50

## 2022-04-30 MED ORDER — SODIUM CHLORIDE 0.45 % IV SOLN: INTRAVENOUS | Status: AC

## 2022-04-30 MED ORDER — ALUM & MAG HYDROXIDE-SIMETH 200-200-20 MG/5ML PO SUSP
30.0000 mL | Freq: Once | ORAL | Status: AC
  Administered 2022-04-30: 30 mL via ORAL
  Filled 2022-04-30: qty 30

## 2022-04-30 NOTE — ED Triage Notes (Signed)
Patient reports to the ER for Mid-upper stomach pains that radiates to her back. Patient endorses nausea. Denies chest pain or ShOB.

## 2022-04-30 NOTE — ED Provider Notes (Signed)
[facility]ERG[facility] DEPT Provider Note   CSN: [id number] Arrival date & time: 04/30/22  1351     History  Chief Complaint  Patient presents with   Abdominal Pain    Felicia Cole is a 41 y.o. female.  41 yo F with a chief complaint of abdominal pain.  This has been going on for some time but it was worse today than it has been.  She tells me that her pain typically is in the epigastrium usually occurs after eating and tends to improve with Prilosec and Pepcid.  She had an episode today about 9 AM and has gotten a little bit better with Pepcid and Prilosec but has not resolved.  It is a sharp pain that goes straight into her back.  Improves with sitting straight up and gets worse when she moves.  She denies chest pain denies cough or congestion.  Denies shortness of breath.  She denies prior abdominal surgery.   Abdominal Pain     Home Medications Prior to Admission medications   Medication Sig Start Date End Date Taking? Authorizing Provider  cetirizine (ZYRTEC) 10 MG tablet Take 10 mg by mouth daily.    [provider]  EPINEPHrine 0.3 mg/0.3 mL IJ SOAJ injection Inject 0.3 mLs (0.3 mg total) into the muscle once. 12/26/15   Gregor Hams, MD  MELATONIN PO Take by mouth.    [provider]  venlafaxine XR (EFFEXOR-XR) 75 MG 24 hr capsule Take 1 capsule (75 mg total) by mouth daily with breakfast. 05/09/19   Emeterio Reeve, DO      Allergies    Bee venom and Shellfish allergy    Review of Systems   Review of Systems  Gastrointestinal:  Positive for abdominal pain.   Physical Exam Updated Vital Signs BP 109/83   Pulse 82   Temp 98.6 F (37 C) (Oral)   Resp 15   SpO2 100%  Physical Exam Vitals and nursing note reviewed.  Constitutional:      General: She is not in acute distress.    Appearance: She is well-developed. She is not diaphoretic.  HENT:     Head: Normocephalic and atraumatic.  Eyes:     Pupils: Pupils are equal,  round, and reactive to light.  Cardiovascular:     Rate and Rhythm: Normal rate and regular rhythm.     Heart sounds: No murmur heard.   No friction rub. No gallop.  Pulmonary:     Effort: Pulmonary effort is normal.     Breath sounds: No wheezing or rales.  Abdominal:     General: There is no distension.     Palpations: Abdomen is soft.     Tenderness: There is abdominal tenderness.     Comments: Diffuse upper abdominal discomfort but worse to the epigastrium.  Mild pain at the right upper quadrant.  Musculoskeletal:        General: No tenderness.     Cervical back: Normal range of motion and neck supple.  Skin:    General: Skin is warm and dry.  Neurological:     Mental Status: She is alert and oriented to person, place, and time.  Psychiatric:        Behavior: Behavior normal.    ED Results / Procedures / Treatments   Labs (all labs ordered are listed, but only abnormal results are displayed) Labs Reviewed  CBC WITH DIFFERENTIAL/PLATELET - Abnormal; Notable for the following components:      Result  Value   WBC 10.8 (*)    Neutro Abs 9.1 (*)    All other components within normal limits  COMPREHENSIVE METABOLIC PANEL - Abnormal; Notable for the following components:   Glucose, Bld 121 (*)    AST 625 (*)    ALT 368 (*)    Total Bilirubin 1.5 (*)    All other components within normal limits  LIPASE, BLOOD  TROPONIN I (HIGH SENSITIVITY)    EKG EKG Interpretation  Date/Time:  Thursday April 30 2022 14:12:52 EDT Ventricular Rate:  84 PR Interval:  141 QRS Duration: 125 QT Interval:  428 QTC Calculation: 506 R Axis:   92 Text Interpretation: Sinus rhythm RBBB and LPFB Baseline wander in lead(s) V2 V3 Partial missing lead(s): V2 No old tracing to compare Confirmed by Deno Etienne [phone]) on 04/30/2022 2:33:40 PM  Radiology No results found.  Procedures Procedures    Medications Ordered in ED Medications  morphine (PF) 4 MG/ML injection 4 mg (4 mg Intravenous Not  Given 04/30/22 1430)  ondansetron (ZOFRAN) injection 4 mg (4 mg Intravenous Given 04/30/22 1419)  alum & mag hydroxide-simeth (MAALOX/MYLANTA) 200-200-20 MG/5ML suspension 30 mL (30 mLs Oral Given 04/30/22 1418)    And  lidocaine (XYLOCAINE) 2 % viscous mouth solution 15 mL (15 mLs Oral Given 04/30/22 1418)  morphine (PF) 2 MG/ML injection 2 mg (2 mg Intravenous Given 04/30/22 1436)  morphine (PF) 2 MG/ML injection 2 mg (2 mg Intravenous Given 04/30/22 1505)    ED Course/ Medical Decision Making/ A&P                           Medical Decision Making Amount and/or Complexity of Data Reviewed Labs: ordered. Radiology: ordered. ECG/medicine tests: ordered.  Risk OTC drugs. Prescription drug management.   41 yo F with a chief complaint of epigastric abdominal pain.  This has been going on since about 9 AM.  Has had pain like this off and on for some time but was worse today than it has been and lasting a bit longer.  She is worried that it might be her gallbladder and her mother is worried that it might be her appendix.  No fevers no vomiting.  By history it seems like she has had pain like this before that significantly improved with acid reducing agents.  Most likely reflux by history and physical.  Due to the level of discomfort we will obtain an x-ray of the chest.  Blood work.  Treat pain and nausea.  GI cocktail.  Reassess.   Patient is feeling a bit better on reassessment.  Has a mild leukocytosis LFTs are elevated which is new for her.  Lipase is normal.  Right upper quadrant ultrasound ordered.  I discussed the results with the patient.  Patient care was signed out to Dr. Billy Cole, please see her note for further details care in the ED.  Plan for ultrasound if negative likely will discuss with GI.  The patients results and plan were reviewed and discussed.   Any x-rays performed were independently reviewed by myself.   Differential diagnosis were considered with the presenting  HPI.  Medications  morphine (PF) 4 MG/ML injection 4 mg (4 mg Intravenous Not Given 04/30/22 1430)  ondansetron (ZOFRAN) injection 4 mg (4 mg Intravenous Given 04/30/22 1419)  alum & mag hydroxide-simeth (MAALOX/MYLANTA) 200-200-20 MG/5ML suspension 30 mL (30 mLs Oral Given 04/30/22 1418)    And  lidocaine (XYLOCAINE) 2 % viscous  mouth solution 15 mL (15 mLs Oral Given 04/30/22 1418)  morphine (PF) 2 MG/ML injection 2 mg (2 mg Intravenous Given 04/30/22 1436)  morphine (PF) 2 MG/ML injection 2 mg (2 mg Intravenous Given 04/30/22 1505)    Vitals:   04/30/22 1358 04/30/22 1400 04/30/22 1500  BP: (!) 127/102 120/81 109/83  Pulse: 91 87 82  Resp: 18  15  Temp: 98.6 F (37 C)    TempSrc: Oral    SpO2: 100% 100% 100%    Final diagnoses:  LFT elevation  Upper abdominal pain    Admission/ observation were discussed with the admitting physician, patient and/or family and they are comfortable with the plan.         Final Clinical Impression(s) / ED Diagnoses Final diagnoses:  LFT elevation  Upper abdominal pain    Rx / DC Orders ED Discharge Orders     None         Deno Etienne, DO 04/30/22 1513

## 2022-04-30 NOTE — Progress Notes (Signed)
Pharmacy Antibiotic Note  Felicia Cole is a 41 y.o. female for which pharmacy has been consulted for zosyn dosing for  intra-abdominal infection .  Patient presenting with abdominal pain 2/2 cholelithiasis with multiple gallbladder stones suggestive of cholecystitis on imaging.  SCr 0.76 -  baseline ~0.8, lipase WNL, LFTs elevated  WBC 10.8; afebrile   Plan: Zosyn 3.375g IV q8h (4 hour infusion) Trend WBC, Fever, Renal function, & Clinical course F/u cultures, clinical course, WBC, fever De-escalate when able    Temp (24hrs), Avg:98.5 F (36.9 C), Min:98.3 F (36.8 C), Max:98.6 F (37 C)  Recent Labs  Lab 04/30/22 1402  WBC 10.8*  CREATININE 0.76     CrCl cannot be calculated (Unknown ideal weight.).    Allergies  Allergen Reactions   Bee Venom Anaphylaxis    Patient carries epipen   Shellfish Allergy Anaphylaxis    Patient carries epipen    Antimicrobials this admission: zosyn 6/1 >>  Microbiology results: N/A  Thank you for allowing pharmacy to be a part of this patient's care.  Adria Dill, PharmD PGY-1 Acute Care Resident  04/30/2022 7:08 PM

## 2022-04-30 NOTE — ED Notes (Signed)
Patient reports pain is tolerable and does not wish for further pain intervention at this time

## 2022-04-30 NOTE — ED Provider Notes (Signed)
Past Medical History:  Diagnosis Date   Allergy    Anxiety    Asthma    Herpesvirus 2    Vaginal delivery 2013, 2016X2   Vitamin D deficiency     Physical Exam  BP 109/76   Pulse 78   Temp 98.6 F (37 C) (Oral)   Resp 17   SpO2 100%   Physical Exam  Procedures  Procedures  ED Course / MDM    Medical Decision Making Amount and/or Complexity of Data Reviewed Labs: ordered. Radiology: ordered. ECG/medicine tests: ordered.  Risk OTC drugs. Prescription drug management.   ***

## 2022-05-01 ENCOUNTER — Encounter (HOSPITAL_COMMUNITY)
Admission: EM | Disposition: A | Discharge status: Home / Self Care | Payer: Self-pay | Source: Home / Self Care | Attending: Emergency Medicine

## 2022-05-01 ENCOUNTER — Other Ambulatory Visit: Payer: Self-pay

## 2022-05-01 ENCOUNTER — Observation Stay (HOSPITAL_COMMUNITY): Payer: No Typology Code available for payment source | Admitting: Certified Registered Nurse Anesthetist

## 2022-05-01 ENCOUNTER — Observation Stay (HOSPITAL_COMMUNITY): Payer: No Typology Code available for payment source

## 2022-05-01 DIAGNOSIS — K8020 Calculus of gallbladder without cholecystitis without obstruction: Secondary | ICD-10-CM | POA: Diagnosis not present

## 2022-05-01 DIAGNOSIS — K8000 Calculus of gallbladder with acute cholecystitis without obstruction: Secondary | ICD-10-CM | POA: Diagnosis present

## 2022-05-01 HISTORY — PX: INTRAOPERATIVE CHOLANGIOGRAM: SHX5230

## 2022-05-01 HISTORY — PX: CHOLECYSTECTOMY: SHX55

## 2022-05-01 LAB — CREATININE, SERUM
Creatinine, Ser: 0.88 mg/dL (ref 0.44–1.00)
GFR, Estimated: >60 mL/min (ref >60)

## 2022-05-01 LAB — CBC
HCT: 37.6 % (ref 36.0–46.0)
Hemoglobin: 12.6 g/dL (ref 12.0–15.0)
MCH: 31.3 pg (ref 26.0–34.0)
MCHC: 33.5 g/dL (ref 30.0–36.0)
MCV: 93.5 fL (ref 80.0–100.0)
Platelets: 194 10*3/uL (ref 150–400)
RBC: 4.02 MIL/uL (ref 3.87–5.11)
RDW: 13 % (ref 11.5–15.5)
WBC: 10.4 10*3/uL (ref 4.0–10.5)
nRBC: 0 % (ref 0.0–0.2)

## 2022-05-01 SURGERY — LAPAROSCOPIC CHOLECYSTECTOMY WITH INTRAOPERATIVE CHOLANGIOGRAM
Anesthesia: General | Site: Abdomen

## 2022-05-01 MED ORDER — POLYETHYLENE GLYCOL 3350 17 G PO PACK: 17.0000 g | PACK | Freq: Every day | ORAL | Status: DC | PRN

## 2022-05-01 MED ORDER — PROPOFOL 10 MG/ML IV BOLUS
INTRAVENOUS | Status: AC
  Filled 2022-05-01: qty 20

## 2022-05-01 MED ORDER — DIPHENHYDRAMINE HCL 50 MG/ML IJ SOLN: 25.0000 mg | Freq: Four times a day (QID) | INTRAMUSCULAR | Status: DC | PRN

## 2022-05-01 MED ORDER — FENTANYL CITRATE (PF) 100 MCG/2ML IJ SOLN: 50.0000 ug | Freq: Once | INTRAMUSCULAR | Status: AC

## 2022-05-01 MED ORDER — SODIUM CHLORIDE 0.9 % IV SOLN
INTRAVENOUS | Status: DC | PRN
  Administered 2022-05-01: 6 mL

## 2022-05-01 MED ORDER — BUPIVACAINE HCL 0.25 % IJ SOLN
INTRAMUSCULAR | Status: DC | PRN
  Administered 2022-05-01: 30 mL

## 2022-05-01 MED ORDER — SCOPOLAMINE 1 MG/3DAYS TD PT72
MEDICATED_PATCH | TRANSDERMAL | Status: AC
  Administered 2022-05-01: 1.5 mg via TRANSDERMAL
  Filled 2022-05-01: qty 1

## 2022-05-01 MED ORDER — VENLAFAXINE HCL ER 75 MG PO CP24: 75.0000 mg | ORAL_CAPSULE | Freq: Every day | ORAL | Status: DC

## 2022-05-01 MED ORDER — DIPHENHYDRAMINE HCL 25 MG PO CAPS: 25.0000 mg | ORAL_CAPSULE | Freq: Four times a day (QID) | ORAL | Status: DC | PRN

## 2022-05-01 MED ORDER — FENTANYL CITRATE (PF) 250 MCG/5ML IJ SOLN
INTRAMUSCULAR | Status: DC | PRN
  Administered 2022-05-01: 50 ug via INTRAVENOUS
  Administered 2022-05-01: 100 ug via INTRAVENOUS
  Administered 2022-05-01: 50 ug via INTRAVENOUS

## 2022-05-01 MED ORDER — FENTANYL CITRATE (PF) 250 MCG/5ML IJ SOLN
INTRAMUSCULAR | Status: AC
  Filled 2022-05-01: qty 5

## 2022-05-01 MED ORDER — MIDAZOLAM HCL 2 MG/2ML IJ SOLN
INTRAMUSCULAR | Status: AC
  Filled 2022-05-01: qty 2

## 2022-05-01 MED ORDER — FAMOTIDINE 20 MG PO TABS: 10.0000 mg | ORAL_TABLET | Freq: Every day | ORAL | Status: DC | PRN

## 2022-05-01 MED ORDER — FENTANYL CITRATE (PF) 100 MCG/2ML IJ SOLN: 25.0000 ug | INTRAMUSCULAR | Status: DC | PRN

## 2022-05-01 MED ORDER — ONDANSETRON HCL 4 MG/2ML IJ SOLN
INTRAMUSCULAR | Status: DC | PRN
  Administered 2022-05-01: 4 mg via INTRAVENOUS

## 2022-05-01 MED ORDER — OXYCODONE HCL 5 MG PO TABS
5.0000 mg | ORAL_TABLET | ORAL | Status: DC | PRN
  Administered 2022-05-01: 10 mg via ORAL
  Filled 2022-05-01: qty 2

## 2022-05-01 MED ORDER — SIMETHICONE 80 MG PO CHEW: 40.0000 mg | CHEWABLE_TABLET | Freq: Four times a day (QID) | ORAL | Status: DC | PRN

## 2022-05-01 MED ORDER — FENTANYL CITRATE (PF) 100 MCG/2ML IJ SOLN
INTRAMUSCULAR | Status: AC
  Administered 2022-05-01: 50 ug via INTRAVENOUS
  Filled 2022-05-01: qty 2

## 2022-05-01 MED ORDER — ONDANSETRON 4 MG PO TBDP: 4.0000 mg | ORAL_TABLET | Freq: Four times a day (QID) | ORAL | Status: DC | PRN

## 2022-05-01 MED ORDER — KETOROLAC TROMETHAMINE 30 MG/ML IJ SOLN
INTRAMUSCULAR | Status: AC
  Filled 2022-05-01: qty 1

## 2022-05-01 MED ORDER — CHLORHEXIDINE GLUCONATE 0.12 % MT SOLN
15.0000 mL | Freq: Once | OROMUCOSAL | Status: AC
  Administered 2022-05-01: 15 mL via OROMUCOSAL
  Filled 2022-05-01: qty 15

## 2022-05-01 MED ORDER — MORPHINE SULFATE (PF) 2 MG/ML IV SOLN
2.0000 mg | INTRAVENOUS | Status: DC | PRN
  Administered 2022-05-01: 2 mg via INTRAVENOUS
  Filled 2022-05-01: qty 1

## 2022-05-01 MED ORDER — OXYCODONE HCL 5 MG PO TABS: 5.0000 mg | ORAL_TABLET | Freq: Four times a day (QID) | ORAL | 0 refills | Status: DC | PRN

## 2022-05-01 MED ORDER — SODIUM CHLORIDE 0.9 % IR SOLN
Status: DC | PRN
  Administered 2022-05-01: 1000 mL

## 2022-05-01 MED ORDER — AMISULPRIDE (ANTIEMETIC) 5 MG/2ML IV SOLN: 10.0000 mg | Freq: Once | INTRAVENOUS | Status: DC | PRN

## 2022-05-01 MED ORDER — PROPOFOL 10 MG/ML IV BOLUS
INTRAVENOUS | Status: DC | PRN
  Administered 2022-05-01: 150 mg via INTRAVENOUS

## 2022-05-01 MED ORDER — KETOROLAC TROMETHAMINE 30 MG/ML IJ SOLN
INTRAMUSCULAR | Status: DC | PRN
  Administered 2022-05-01: 30 mg via INTRAVENOUS

## 2022-05-01 MED ORDER — ONDANSETRON HCL 4 MG/2ML IJ SOLN
4.0000 mg | Freq: Once | INTRAMUSCULAR | Status: DC | PRN
Start: 2022-05-01 — End: 2022-05-01

## 2022-05-01 MED ORDER — LACTATED RINGERS IV SOLN: INTRAVENOUS | Status: DC

## 2022-05-01 MED ORDER — LORATADINE 10 MG PO TABS
10.0000 mg | ORAL_TABLET | Freq: Every day | ORAL | Status: DC
  Filled 2022-05-01: qty 1

## 2022-05-01 MED ORDER — OXYCODONE HCL 5 MG PO TABS: 5.0000 mg | ORAL_TABLET | Freq: Once | ORAL | Status: DC | PRN

## 2022-05-01 MED ORDER — SCOPOLAMINE 1 MG/3DAYS TD PT72: 1.0000 | MEDICATED_PATCH | TRANSDERMAL | Status: DC

## 2022-05-01 MED ORDER — ROCURONIUM BROMIDE 10 MG/ML (PF) SYRINGE
PREFILLED_SYRINGE | INTRAVENOUS | Status: DC | PRN
  Administered 2022-05-01: 70 mg via INTRAVENOUS

## 2022-05-01 MED ORDER — OXYCODONE HCL 5 MG/5ML PO SOLN: 5.0000 mg | Freq: Once | ORAL | Status: DC | PRN

## 2022-05-01 MED ORDER — ACETAMINOPHEN 500 MG PO TABS
1000.0000 mg | ORAL_TABLET | Freq: Four times a day (QID) | ORAL | Status: DC
  Administered 2022-05-01: 1000 mg via ORAL
  Filled 2022-05-01: qty 2

## 2022-05-01 MED ORDER — ORAL CARE MOUTH RINSE: 15.0000 mL | Freq: Once | OROMUCOSAL | Status: AC

## 2022-05-01 MED ORDER — MIDAZOLAM HCL 2 MG/2ML IJ SOLN
INTRAMUSCULAR | Status: DC | PRN
  Administered 2022-05-01: 2 mg via INTRAVENOUS

## 2022-05-01 MED ORDER — IBUPROFEN 800 MG PO TABS: 800.0000 mg | ORAL_TABLET | Freq: Three times a day (TID) | ORAL | 0 refills | Status: DC | PRN

## 2022-05-01 MED ORDER — IBUPROFEN 600 MG PO TABS: 600.0000 mg | ORAL_TABLET | Freq: Four times a day (QID) | ORAL | Status: DC | PRN

## 2022-05-01 MED ORDER — ENOXAPARIN SODIUM 40 MG/0.4ML IJ SOSY: 40.0000 mg | PREFILLED_SYRINGE | INTRAMUSCULAR | Status: DC

## 2022-05-01 MED ORDER — SODIUM CHLORIDE 0.9 % IV SOLN: INTRAVENOUS | Status: DC

## 2022-05-01 MED ORDER — ACETAMINOPHEN 500 MG PO TABS: 1000.0000 mg | ORAL_TABLET | Freq: Once | ORAL | Status: AC

## 2022-05-01 MED ORDER — ACETAMINOPHEN 500 MG PO TABS
ORAL_TABLET | ORAL | Status: AC
  Administered 2022-05-01: 1000 mg via ORAL
  Filled 2022-05-01: qty 2

## 2022-05-01 MED ORDER — DEXAMETHASONE SODIUM PHOSPHATE 10 MG/ML IJ SOLN
INTRAMUSCULAR | Status: DC | PRN
  Administered 2022-05-01: 5 mg via INTRAVENOUS

## 2022-05-01 MED ORDER — BUPIVACAINE HCL (PF) 0.25 % IJ SOLN
INTRAMUSCULAR | Status: AC
  Filled 2022-05-01: qty 30

## 2022-05-01 MED ORDER — SUGAMMADEX SODIUM 200 MG/2ML IV SOLN
INTRAVENOUS | Status: DC | PRN
  Administered 2022-05-01: 200 mg via INTRAVENOUS

## 2022-05-01 MED ORDER — 0.9 % SODIUM CHLORIDE (POUR BTL) OPTIME
TOPICAL | Status: DC | PRN
  Administered 2022-05-01: 1000 mL

## 2022-05-01 MED ORDER — ONDANSETRON HCL 4 MG/2ML IJ SOLN: 4.0000 mg | Freq: Four times a day (QID) | INTRAMUSCULAR | Status: DC | PRN

## 2022-05-01 MED ORDER — LIDOCAINE 2% (20 MG/ML) 5 ML SYRINGE
INTRAMUSCULAR | Status: DC | PRN
  Administered 2022-05-01: 50 mg via INTRAVENOUS

## 2022-05-01 SURGICAL SUPPLY — 47 items
ADH SKN CLS APL DERMABOND .7 (GAUZE/BANDAGES/DRESSINGS) ×1
APL PRP STRL LF DISP 70% ISPRP (MISCELLANEOUS) ×1
APPLIER CLIP ROT 10 11.4 M/L (STAPLE) ×2
APR CLP MED LRG 11.4X10 (STAPLE) ×1
BAG COUNTER SPONGE SURGICOUNT (BAG) ×2 IMPLANT
BAG SPEC RTRVL 10 TROC 200 (ENDOMECHANICALS) ×1
BAG SPNG CNTER NS LX DISP (BAG) ×1
BLADE CLIPPER SURG (BLADE) IMPLANT
CANISTER SUCT 3000ML PPV (MISCELLANEOUS) ×2 IMPLANT
CATH CHOLANG 76X19 KUMAR (CATHETERS) ×2 IMPLANT
CHLORAPREP W/TINT 26 (MISCELLANEOUS) ×2 IMPLANT
CLIP APPLIE ROT 10 11.4 M/L (STAPLE) IMPLANT
CLIP LIGATING HEMO O LOK GREEN (MISCELLANEOUS) ×1 IMPLANT
COVER MAYO STAND STRL (DRAPES) ×2 IMPLANT
COVER SURGICAL LIGHT HANDLE (MISCELLANEOUS) ×2 IMPLANT
DERMABOND ADVANCED (GAUZE/BANDAGES/DRESSINGS) ×1
DERMABOND ADVANCED .7 DNX12 (GAUZE/BANDAGES/DRESSINGS) ×1 IMPLANT
DRAPE C-ARM 42X120 X-RAY (DRAPES) ×2 IMPLANT
ELECT REM PT RETURN 9FT ADLT (ELECTROSURGICAL) ×2
ELECTRODE REM PT RTRN 9FT ADLT (ELECTROSURGICAL) ×1 IMPLANT
GLOVE BIOGEL PI IND STRL 7.0 (GLOVE) ×1 IMPLANT
GLOVE BIOGEL PI INDICATOR 7.0 (GLOVE) ×1
GLOVE SURG SS PI 7.0 STRL IVOR (GLOVE) ×2 IMPLANT
GOWN STRL REUS W/ TWL LRG LVL3 (GOWN DISPOSABLE) ×3 IMPLANT
GOWN STRL REUS W/TWL LRG LVL3 (GOWN DISPOSABLE) ×6
GRASPER SUT TROCAR 14GX15 (MISCELLANEOUS) ×2 IMPLANT
INTRODUCER SET COOK 14FR (MISCELLANEOUS) ×1 IMPLANT
KIT BASIN OR (CUSTOM PROCEDURE TRAY) ×2 IMPLANT
KIT TURNOVER KIT B (KITS) ×2 IMPLANT
NEEDLE 22X1 1/2 (OR ONLY) (NEEDLE) ×2 IMPLANT
NS IRRIG 1000ML POUR BTL (IV SOLUTION) ×2 IMPLANT
PAD ARMBOARD 7.5X6 YLW CONV (MISCELLANEOUS) ×2 IMPLANT
POUCH RETRIEVAL ECOSAC 10 (ENDOMECHANICALS) ×1 IMPLANT
POUCH RETRIEVAL ECOSAC 10MM (ENDOMECHANICALS) ×2
SCISSORS LAP 5X35 DISP (ENDOMECHANICALS) ×2 IMPLANT
SET IRRIG TUBING LAPAROSCOPIC (IRRIGATION / IRRIGATOR) ×2 IMPLANT
SET TUBE SMOKE EVAC HIGH FLOW (TUBING) ×2 IMPLANT
SLEEVE Z-THREAD 5X100MM (TROCAR) ×2 IMPLANT
SPECIMEN JAR SMALL (MISCELLANEOUS) ×2 IMPLANT
STOPCOCK 4 WAY LG BORE MALE ST (IV SETS) ×2 IMPLANT
SUT MNCRL AB 4-0 PS2 18 (SUTURE) ×2 IMPLANT
TOWEL GREEN STERILE (TOWEL DISPOSABLE) ×2 IMPLANT
TOWEL GREEN STERILE FF (TOWEL DISPOSABLE) ×2 IMPLANT
TRAY LAPAROSCOPIC MC (CUSTOM PROCEDURE TRAY) ×2 IMPLANT
TROCAR ADV FIXATION 5X100MM (TROCAR) ×1 IMPLANT
TROCAR Z-THREAD FIOS 11X100 BL (TROCAR) ×1 IMPLANT
WATER STERILE IRR 1000ML POUR (IV SOLUTION) ×2 IMPLANT

## 2022-05-01 NOTE — Discharge Summary (Signed)
Physician Discharge Summary  Felicia Cole UVO:[id number] DOB: [birth year] DOA: 04/30/2022  PCP: Emeterio Reeve, DO  Admit date: 04/30/2022 Discharge date: 05/01/2022  Recommendations for Outpatient Follow-up:  Liver profile laboratory on 6/5 (include homehealth, outpatient follow-up instructions, specific recommendations for PCP to follow-up on, etc.)   Follow-up Information     [facility] Go on 05/26/2022.   Specialty: General Surgery Why: 9 AM with [name], PA-C. Please arrive 30 min prior to appointment time and have ID and insurance card with you. Contact information: [place] [address] [phone]                Discharge Diagnoses:  Principal Problem:   Symptomatic cholelithiasis Active Problems:   Acute calculous cholecystitis   Surgical Procedure: laparoscopic cholecystectomy  Discharge Condition: Good Disposition: Home  Diet recommendation: low fat diet   Hospital Course:  41 yo female presented to the ED with abdominal pain and was found to have cholecystitis. She underwent laparoscopic cholecystectomy with attempted cholangiogram. Postoperatively she did well. She wanted to go home POD 0 so arrangements were made for outpatient labs to occur.  Discharge Instructions  Discharge Instructions     Call MD for:  difficulty breathing, headache or visual disturbances   Complete by: As directed    Call MD for:  hives   Complete by: As directed    Call MD for:  persistant nausea and vomiting   Complete by: As directed    Call MD for:  redness, tenderness, or signs of infection (pain, swelling, redness, odor or green/yellow discharge around incision site)   Complete by: As directed    Call MD for:  severe uncontrolled pain   Complete by: As directed    Call MD for:  temperature >100.4   Complete by: As directed    Diet - low sodium heart healthy   Complete by: As directed    Discharge wound care:    Complete by: As directed    Ok to shower tomorrow. Glue will likely peel off in 1-3 weeks. No bandage required   Driving Restrictions   Complete by: As directed    No driving while on narcotics   Increase activity slowly   Complete by: As directed    Lifting restrictions   Complete by: As directed    No lifting greater than 20 pounds for 3 weeks      Allergies as of 05/01/2022       Reactions   Bee Venom Anaphylaxis   Patient carries epipen   Shellfish Allergy Anaphylaxis   Patient carries epipen        Medication List     TAKE these medications    cetirizine 10 MG tablet Commonly known as: ZYRTEC Take 10 mg by mouth at bedtime.   EPINEPHrine 0.3 mg/0.3 mL Soaj injection Commonly known as: EPI-PEN Inject 0.3 mLs (0.3 mg total) into the muscle once.   ibuprofen 800 MG tablet Commonly known as: ADVIL Take 1 tablet (800 mg total) by mouth every 8 (eight) hours as needed.   omeprazole 20 MG capsule Commonly known as: PRILOSEC Take 40 mg by mouth daily as needed (Acid Reflux). OTC   oxyCODONE 5 MG immediate release tablet Commonly known as: Oxy IR/ROXICODONE Take 1 tablet (5 mg total) by mouth every 6 (six) hours as needed for severe pain.   PEPCID PO Take 1 tablet by mouth daily as needed (Acid Reflux).   venlafaxine XR 75  MG 24 hr capsule Commonly known as: EFFEXOR-XR Take 1 capsule (75 mg total) by mouth daily with breakfast.               Discharge Care Instructions  (From admission, onward)           Start     Ordered   05/01/22 0000  Discharge wound care:       Comments: Ok to shower tomorrow. Glue will likely peel off in 1-3 weeks. No bandage required   05/01/22 1553            Follow-up Information     [facility]. Go on 05/26/2022.   Specialty: General Surgery Why: 9 AM with [name], PA-C. Please arrive 30 min prior to appointment time and have ID and insurance card with you. Contact information: [name] [address] [phone]                  The results of significant diagnostics from this hospitalization (including imaging, microbiology, ancillary and laboratory) are listed below for reference.    Significant Diagnostic Studies: DG C-Arm 1-60 Min-No Report  Result Date: 05/01/2022 Fluoroscopy was utilized by the requesting physician.  No radiographic interpretation.   US Abdomen Limited RUQ (LIVER/GB)  Result Date: 04/30/2022 CLINICAL DATA:  Right upper quadrant abdominal pain. EXAM: ULTRASOUND ABDOMEN LIMITED RIGHT UPPER QUADRANT COMPARISON:  None FINDINGS: Gallbladder: Multiple gallstones dependent within the gallbladder, the largest 9 mm. Patient was tender to scanning consistent with a positive sonographic Murphy sign. No objective wall thickening or surrounding fluid however. Common bile duct: Diameter: Upper limits of normal, 6.7 mm. No visible common duct stone. Liver: No focal lesion. Echogenicity normal. Portal vein is patent on color Doppler imaging with normal direction of blood flow towards the liver. Other: None IMPRESSION: Cholelithiasis with multiple gallbladder stones, the largest 9 mm. Positive sonographic Murphy sign suggesting acute cholecystitis. However, no objective wall thickening or surrounding fluid. Electronically Signed   By: Nelson Chimes M.D.   On: 04/30/2022 16:23    Labs: Basic Metabolic Panel: Recent Labs  Lab 04/30/22 1402 05/01/22 1145  NA 137  --   K 3.9  --   CL 103  --   CO2 23  --   GLUCOSE 121*  --   BUN 14  --   CREATININE 0.76 0.88  CALCIUM 9.4  --    Liver Function Tests: Recent Labs  Lab 04/30/22 1402  AST 625*  ALT 368*  ALKPHOS 94  BILITOT 1.5*  PROT 7.4  ALBUMIN 4.4    CBC: Recent Labs  Lab 04/30/22 1402 05/01/22 1145  WBC 10.8* 10.4  NEUTROABS 9.1*  --   HGB 12.0 12.6  HCT 36.1 37.6  MCV 90.7 93.5  PLT 231 194    CBG: No results for input(s): GLUCAP in the last 168  hours.  Principal Problem:   Symptomatic cholelithiasis Active Problems:   Acute calculous cholecystitis   Time coordinating discharge: 15 min

## 2022-05-01 NOTE — Op Note (Signed)
PATIENT:  Felicia Cole  41 y.o. female  PRE-OPERATIVE DIAGNOSIS:  Gallstone  POST-OPERATIVE DIAGNOSIS:  Gallstone  PROCEDURE:  Procedure(s): LAPAROSCOPIC CHOLECYSTECTOMY ATTEMPTED INTRAOPERATIVE CHOLANGIOGRAM   SURGEON:  Unita Detamore, Arta Bruce, MD   ASSISTANT: Ferdinand Cava, PAC  ANESTHESIA:   local and general  Indications for procedure: [name] is a 41 y.o. female with symptoms of Abdominal pain and Nausea and vomiting consistent with gallbladder disease, Confirmed by ultrasound.  Description of procedure: The patient was brought into the operative suite, placed supine. Anesthesia was administered with endotracheal tube. Patient was strapped in place and foot board was secured. All pressure points were offloaded by foam padding. The patient was prepped and draped in the usual sterile fashion.  A periumbilical incision was made and optical entry was used to enter the abdomen. 2 5 mm trocars were placed on in the right lateral space on in the right subcostal space. A 81m trocar was placed in the subxiphoid space. Marcaine was infused to the subxiphoid space and lateral upper right abdomen in the transversus abdominis plane. Next the patient was placed in reverse trendelenberg. The gallbladder appeareddilated and acutely inflamed.   The gallbladder was retracted cephalad and lateral. The peritoneum was reflected off the infundibulum working lateral to medial. The cystic duct and cystic artery were identified and further dissection revealed a critical view, due to concern for choledocholithiasis a cholangiogram was performed with ductotomy and cook catheter passed through a separate subcostal stab incision. The cystic duct kept having leaking despite several attempts with positioning. Therefore biliary visualization was not completed. The cystic duct and cystic artery were doubly clipped and ligated.   The gallbladder was removed off the liver bed with cautery. The Gallbladder was  placed in a specimen bag. The gallbladder fossa was irrigated and hemostasis was applied with cautery. The gallbladder was removed via the 179mtrocar. No dilation was required for removal, therefore no fascial closure was performed and loose stones were removed. Pneumoperitoneum was removed, all trocar were removed. All incisions were closed with 4-0 monocryl subcuticular stitch. The patient woke from anesthesia and was brought to PACU in stable condition. All counts were correct  Findings: chronic inflammation of the gallbladder, incomplete cholangiogram  Specimen: gallbladder  Blood loss: 10 ml  Local anesthesia: 30 ml Marcaine  Complications: none  PLAN OF CARE: Admit for overnight observation  PATIENT DISPOSITION:  PACU - hemodynamically stable.  LuMennourgery, PAUtah

## 2022-05-01 NOTE — Progress Notes (Signed)
AVS given to patient.

## 2022-05-01 NOTE — Anesthesia Preprocedure Evaluation (Signed)
Anesthesia Evaluation  Patient identified by MRN, date of birth, ID band Patient awake    Reviewed: Allergy & Precautions, NPO status , Patient's Chart, lab work & pertinent test results  Airway Mallampati: II  TM Distance: >3 FB Neck ROM: Full    Dental no notable dental hx.    Pulmonary asthma ,    Pulmonary exam normal breath sounds clear to auscultation       Cardiovascular Exercise Tolerance: Good negative cardio ROS Normal cardiovascular exam Rhythm:Regular Rate:Normal     Neuro/Psych PSYCHIATRIC DISORDERS Anxiety negative neurological ROS     GI/Hepatic negative GI ROS, Neg liver ROS,   Endo/Other  negative endocrine ROS  Renal/GU negative Renal ROS  negative genitourinary   Musculoskeletal negative musculoskeletal ROS (+)   Abdominal   Peds negative pediatric ROS (+)  Hematology negative hematology ROS (+)   Anesthesia Other Findings   Reproductive/Obstetrics negative OB ROS                             Anesthesia Physical Anesthesia Plan  ASA: 2  Anesthesia Plan: General   Post-op Pain Management: Tylenol PO (pre-op)*   Induction: Intravenous  PONV Risk Score and Plan: 3 and Treatment may vary due to age or medical condition, Ondansetron, Dexamethasone, Scopolamine patch - Pre-op and Midazolam  Airway Management Planned: Oral ETT  Additional Equipment: None  Intra-op Plan:   Post-operative Plan: Extubation in OR  Informed Consent: I have reviewed the patients History and Physical, chart, labs and discussed the procedure including the risks, benefits and alternatives for the proposed anesthesia with the patient or authorized representative who has indicated his/her understanding and acceptance.     Dental advisory given  Plan Discussed with: CRNA, Anesthesiologist and Surgeon  Anesthesia Plan Comments:         Anesthesia Quick Evaluation

## 2022-05-01 NOTE — H&P (Signed)
Reason for Consult:abdominal pain Referring Provider: Gareth Morgan  Felicia Cole is an 41 y.o. female.  HPI: 41 yo female with 1 day history of abdominal pain. Pain is epigastric. It radiates to the back. She had nausea and warmth. She denies diarrhea.  Past Medical History:  Diagnosis Date   Allergy    Anxiety    Asthma    Herpesvirus 2    Vaginal delivery 2013, 2016X2   Vitamin D deficiency     Past Surgical History:  Procedure Laterality Date   BREAST SURGERY  04/13/2017   augmentation    LAPAROSCOPIC TUBAL LIGATION Bilateral 11/15/2015   Procedure: LAPAROSCOPIC TUBAL LIGATION;  Surgeon: Brien Few, MD;  Location: [facility];  Service: Gynecology;  Laterality: Bilateral;   tummy tuck  04/16/2017    Family History  Problem Relation Age of Onset   Hypertension Mother    Anxiety disorder Mother    Thyroid disease Mother    Epilepsy Father    ALS Father    Cancer Maternal Aunt        breast   Heart disease Maternal Grandmother    Cancer Maternal Grandmother        breast   Heart disease Maternal Grandfather    Cancer Paternal Grandmother        ovarian   Heart disease Paternal Grandmother    Heart disease Paternal Grandfather    Hypertension Paternal Grandfather     Social History:  reports that she has never smoked. She has never used smokeless tobacco. She reports that she does not drink alcohol and does not use drugs.  Allergies:  Allergies  Allergen Reactions   Bee Venom Anaphylaxis    Patient carries epipen   Shellfish Allergy Anaphylaxis    Patient carries epipen    Medications: I have reviewed the patient's current medications.  Results for orders placed or performed during the hospital encounter of 04/30/22 (from the past 48 hour(s))  CBC with Differential     Status: Abnormal   Collection Time: 04/30/22  2:02 PM  Result Value Ref Range   WBC 10.8 (H) 4.0 - 10.5 K/uL   RBC 3.98 3.87 - 5.11 MIL/uL   Hemoglobin 12.0 12.0 - 15.0 g/dL    HCT 36.1 36.0 - 46.0 %   MCV 90.7 80.0 - 100.0 fL   MCH 30.2 26.0 - 34.0 pg   MCHC 33.2 30.0 - 36.0 g/dL   RDW 13.1 11.5 - 15.5 %   Platelets 231 150 - 400 K/uL   nRBC 0.0 0.0 - 0.2 %   Neutrophils Relative % 85 %   Neutro Abs 9.1 (H) 1.7 - 7.7 K/uL   Lymphocytes Relative 9 %   Lymphs Abs 1.0 0.7 - 4.0 K/uL   Monocytes Relative 6 %   Monocytes Absolute 0.6 0.1 - 1.0 K/uL   Eosinophils Relative 0 %   Eosinophils Absolute 0.0 0.0 - 0.5 K/uL   Basophils Relative 0 %   Basophils Absolute 0.0 0.0 - 0.1 K/uL   Immature Granulocytes 0 %   Abs Immature Granulocytes 0.03 0.00 - 0.07 K/uL    Comment: Performed at [facility], [place], [place] [address]  Comprehensive metabolic panel     Status: Abnormal   Collection Time: 04/30/22  2:02 PM  Result Value Ref Range   Sodium 137 135 - 145 mmol/L   Potassium 3.9 3.5 - 5.1 mmol/L   Chloride 103 98 - 111 mmol/L   CO2  23 22 - 32 mmol/L   Glucose, Bld 121 (H) 70 - 99 mg/dL    Comment: Glucose reference range applies only to samples taken after fasting for at least 8 hours.   BUN 14 6 - 20 mg/dL   Creatinine, Ser 0.76 0.44 - 1.00 mg/dL   Calcium 9.4 8.9 - 10.3 mg/dL   Total Protein 7.4 6.5 - 8.1 g/dL   Albumin 4.4 3.5 - 5.0 g/dL   AST 625 (H) 15 - 41 U/L   ALT 368 (H) 0 - 44 U/L   Alkaline Phosphatase 94 38 - 126 U/L   Total Bilirubin 1.5 (H) 0.3 - 1.2 mg/dL   GFR, Estimated >60 >60 mL/min    Comment: (NOTE) Calculated using the CKD-EPI Creatinine Equation (2021)    Anion gap 11 5 - 15    Comment: Performed at [facility], [place], [place] [address]  Lipase, blood     Status: None   Collection Time: 04/30/22  2:02 PM  Result Value Ref Range   Lipase 30 11 - 51 U/L    Comment: Performed at [facility], [place], [place] [address]  Troponin I (High Sensitivity)     Status: None   Collection Time: 04/30/22  2:02 PM  Result Value  Ref Range   Troponin I (High Sensitivity) 3 <18 ng/L    Comment: (NOTE) Elevated high sensitivity troponin I (hsTnI) values and significant  changes across serial measurements may suggest ACS but many other  chronic and acute conditions are known to elevate hsTnI results.  Refer to the "Links" section for chest pain algorithms and additional  guidance. Performed at [facility], [place], [place] [address]     US Abdomen Limited RUQ (LIVER/GB)  Result Date: 04/30/2022 CLINICAL DATA:  Right upper quadrant abdominal pain. EXAM: ULTRASOUND ABDOMEN LIMITED RIGHT UPPER QUADRANT COMPARISON:  None FINDINGS: Gallbladder: Multiple gallstones dependent within the gallbladder, the largest 9 mm. Patient was tender to scanning consistent with a positive sonographic Murphy sign. No objective wall thickening or surrounding fluid however. Common bile duct: Diameter: Upper limits of normal, 6.7 mm. No visible common duct stone. Liver: No focal lesion. Echogenicity normal. Portal vein is patent on color Doppler imaging with normal direction of blood flow towards the liver. Other: None IMPRESSION: Cholelithiasis with multiple gallbladder stones, the largest 9 mm. Positive sonographic Murphy sign suggesting acute cholecystitis. However, no objective wall thickening or surrounding fluid. Electronically Signed   By: Nelson Chimes M.D.   On: 04/30/2022 16:23    Review of Systems  Constitutional: Negative.   HENT: Negative.    Eyes: Negative.   Respiratory: Negative.    Cardiovascular: Negative.   Gastrointestinal:  Positive for abdominal pain and nausea.  Genitourinary: Negative.   Musculoskeletal: Negative.   Skin: Negative.   Neurological: Negative.   Endo/Heme/Allergies: Negative.   Psychiatric/Behavioral: Negative.     PE Blood pressure 95/71, pulse 66, temperature 98.2 F (36.8 C), temperature source Oral, resp. rate 18, SpO2 98 %. Constitutional: NAD; conversant; no  deformities Eyes: Moist conjunctiva; no lid lag; anicteric; PERRL Neck: Trachea midline; no thyromegaly Lungs: Normal respiratory effort; no tactile fremitus CV: RRR; no palpable thrills; no pitting edema GI: Abd tender in epigastrium; no palpable hepatosplenomegaly MSK: Normal gait; no clubbing/cyanosis Psychiatric: Appropriate affect; alert and oriented x3 Lymphatic: No palpable cervical or axillary lymphadenopathy Skin: No major subcutaneous nodules. Warm and dry   Assessment/Plan: 41 yo female  with acute cholecystitis, LFT elevation -IV zosyn -NPO -OR today for lap chole w ioc -We discussed the etiology of her pain, we discussed treatment options and recommended surgery. We discussed details of surgery including general anesthesia, laparoscopic approach, identification of cystic duct and common bile duct. Ligation of cystic duct and cystic artery. Possible need for intraoperative cholangiogram or open procedure. Possible risks of common bile duct injury, liver injury, cystic duct leak, bleeding, infection, post-cholecystectomy syndrome. The patient showed good understanding and all questions were answered -possible same day discharge  I reviewed last 24 h vitals and pain scores, last 48 h intake and output, last 24 h labs and trends, and last 24 h imaging results.  This care required high  level of medical decision making.   Arta Bruce Sary Bogie 05/01/2022, 7:43 AM

## 2022-05-01 NOTE — Anesthesia Procedure Notes (Signed)
Procedure Name: Intubation Date/Time: 05/01/2022 9:14 AM Performed by: Valda Favia, CRNA Pre-anesthesia Checklist: Patient identified, Emergency Drugs available, Suction available and Patient being monitored Patient Re-evaluated:Patient Re-evaluated prior to induction Oxygen Delivery Method: Circle System Utilized Preoxygenation: Pre-oxygenation with 100% oxygen Induction Type: IV induction Ventilation: Mask ventilation without difficulty and Oral airway inserted - appropriate to patient size Laryngoscope Size: Mac and 4 Grade View: Grade II Tube type: Oral Tube size: 7.0 mm Number of attempts: 1 Airway Equipment and Method: Stylet and Oral airway Placement Confirmation: ETT inserted through vocal cords under direct vision, positive ETCO2 and breath sounds checked- equal and bilateral Secured at: 21 cm Tube secured with: Tape Dental Injury: Teeth and Oropharynx as per pre-operative assessment

## 2022-05-01 NOTE — Anesthesia Postprocedure Evaluation (Signed)
Anesthesia Post Note  Patient: MARLEEN MORET  Procedure(s) Performed: LAPAROSCOPIC CHOLECYSTECTOMY (Abdomen) ATTEMPTED INTRAOPERATIVE CHOLANGIOGRAM (Abdomen)     Patient location during evaluation: PACU Anesthesia Type: General Level of consciousness: awake Pain management: pain level controlled Vital Signs Assessment: post-procedure vital signs reviewed and stable Respiratory status: spontaneous breathing and respiratory function stable Cardiovascular status: stable Postop Assessment: no apparent nausea or vomiting Anesthetic complications: no   No notable events documented.  Last Vitals:  Vitals:   05/01/22 1112 05/01/22 1132  BP: 98/67 95/64  Pulse: 82 79  Resp: 13 14  Temp: 36.5 C   SpO2: 98% 100%    Last Pain:  Vitals:   05/01/22 1220  TempSrc:   PainSc: 9                  Candra R Valen Mascaro

## 2022-05-01 NOTE — Transfer of Care (Signed)
Immediate Anesthesia Transfer of Care Note  Patient: Felicia Cole  Procedure(s) Performed: LAPAROSCOPIC CHOLECYSTECTOMY (Abdomen) ATTEMPTED INTRAOPERATIVE CHOLANGIOGRAM (Abdomen)  Patient Location: PACU  Anesthesia Type:General  Level of Consciousness: drowsy  Airway & Oxygen Therapy: Patient Spontanous Breathing and Patient connected to nasal cannula oxygen  Post-op Assessment: Report given to RN and Post -op Vital signs reviewed and stable  Post vital signs: Reviewed and stable  Last Vitals:  Vitals Value Taken Time  BP 96/61 05/01/22 1043  Temp 36.4 C 05/01/22 1043  Pulse 87 05/01/22 1050  Resp 11 05/01/22 1050  SpO2 100 % 05/01/22 1050  Vitals shown include unvalidated device data.  Last Pain:  Vitals:   05/01/22 1043  TempSrc:   PainSc: 0-No pain      Patients Stated Pain Goal: 0 (15/61/53 7943)  Complications: No notable events documented.

## 2022-05-01 NOTE — Discharge Instructions (Signed)
[facility] LAPAROSCOPIC SURGERY: POST OP INSTRUCTIONS Always review your discharge instruction sheet given to you by the facility where your surgery was performed. IF YOU HAVE DISABILITY OR FAMILY LEAVE FORMS, YOU MUST BRING THEM TO THE OFFICE FOR PROCESSING.   DO NOT GIVE THEM TO YOUR DOCTOR.  PAIN CONTROL  First take acetaminophen (Tylenol) AND/or ibuprofen (Advil) to control your pain after surgery.  Follow directions on package.  Taking acetaminophen (Tylenol) and/or ibuprofen (Advil) regularly after surgery will help to control your pain and lower the amount of prescription pain medication you may need.  You should not take more than 3,000 mg (3 grams) of acetaminophen (Tylenol) in 24 hours.  You should not take ibuprofen (Advil), aleve, motrin, naprosyn or other NSAIDS if you have a history of stomach ulcers or chronic kidney disease.  A prescription for pain medication may be given to you upon discharge.  Take your pain medication as prescribed, if you still have uncontrolled pain after taking acetaminophen (Tylenol) or ibuprofen (Advil). Use ice packs to help control pain. If you need a refill on your pain medication, please contact your pharmacy.  They will contact our office to request authorization. Prescriptions will not be filled after 5pm or on week-ends.  HOME MEDICATIONS Take your usually prescribed medications unless otherwise directed.  DIET You should follow a light diet the first few days after arrival home.  Be sure to include lots of fluids daily. Avoid fatty, fried foods.   CONSTIPATION It is common to experience some constipation after surgery and if you are taking pain medication.  Increasing fluid intake and taking a stool softener (such as Colace) will usually help or prevent this problem from occurring.  A mild laxative (Milk of Magnesia or Miralax) should be taken according to package instructions if there are no bowel movements after 48  hours.  WOUND/INCISION CARE Most patients will experience some swelling and bruising in the area of the incisions.  Ice packs will help.  Swelling and bruising can take several days to resolve.  Unless discharge instructions indicate otherwise, follow guidelines below  STERI-STRIPS - you may remove your outer bandages 48 hours after surgery, and you may shower at that time.  You have steri-strips (small skin tapes) in place directly over the incision.  These strips should be left on the skin for 7-10 days.   DERMABOND/SKIN GLUE - you may shower in 24 hours.  The glue will flake off over the next 2-3 weeks. Any sutures or staples will be removed at the office during your follow-up visit.  ACTIVITIES You may resume regular (light) daily activities beginning the next day--such as daily self-care, walking, climbing stairs--gradually increasing activities as tolerated.  You may have sexual intercourse when it is comfortable.  Refrain from any heavy lifting or straining until approved by your doctor. You may drive when you are no longer taking prescription pain medication, you can comfortably wear a seatbelt, and you can safely maneuver your car and apply brakes.  FOLLOW-UP You should see your doctor in the office for a follow-up appointment approximately 2-3 weeks after your surgery.  You should have been given your post-op/follow-up appointment when your surgery was scheduled.  If you did not receive a post-op/follow-up appointment, make sure that you call for this appointment within a day or two after you arrive home to insure a convenient appointment time.   WHEN TO CALL YOUR DOCTOR: Fever over 101.0 Inability to urinate Continued bleeding from incision.   Increased pain, redness, or drainage from the incision. Increasing abdominal pain  The clinic staff is available to answer your questions during regular business hours.  Please don't hesitate to call and ask to speak to one of the nurses for  clinical concerns.  If you have a medical emergency, go to the nearest emergency room or call 911.  A surgeon from [facility] is always on call at the hospital. [address], [address], [place], [place]  [address] ? [address] [address], [place], [place]   [address] [phone] ? [phone] ? FAX [phone] Web site: [email or web address]      Managing Your Pain After Surgery Without Opioids    Thank you for participating in our program to help patients manage their pain after surgery without opioids. This is part of our effort to provide you with the best care possible, without exposing you or your family to the risk that opioids pose.  What pain can I expect after surgery? You can expect to have some pain after surgery. This is normal. The pain is typically worse the day after surgery, and quickly begins to get better. Many studies have found that many patients are able to manage their pain after surgery with Over-the-Counter (OTC) medications such as Tylenol and Motrin. If you have a condition that does not allow you to take Tylenol or Motrin, notify your surgical team.  How will I manage my pain? The best strategy for controlling your pain after surgery is around the clock pain control with Tylenol (acetaminophen) and Motrin (ibuprofen or Advil). Alternating these medications with each other allows you to maximize your pain control. In addition to Tylenol and Motrin, you can use heating pads or ice packs on your incisions to help reduce your pain.  How will I alternate your regular strength over-the-counter pain medication? You will take a dose of pain medication every three hours. Start by taking 650 mg of Tylenol (2 pills of 325 mg) 3 hours later take 600 mg of Motrin (3 pills of 200 mg) 3 hours after taking the Motrin take 650 mg of Tylenol 3 hours after that take 600 mg of Motrin.   - 1 -  See example - if your first dose of Tylenol is at 12:00  PM   12:00 PM Tylenol 650 mg (2 pills of 325 mg)  3:00 PM Motrin 600 mg (3 pills of 200 mg)  6:00 PM Tylenol 650 mg (2 pills of 325 mg)  9:00 PM Motrin 600 mg (3 pills of 200 mg)  Continue alternating every 3 hours   We recommend that you follow this schedule around-the-clock for at least 3 days after surgery, or until you feel that it is no longer needed. Use the table on the last page of this handout to keep track of the medications you are taking. Important: Do not take more than 3000mg of Tylenol or 3200mg of Motrin in a 24-hour period. Do not take ibuprofen/Motrin if you have a history of bleeding stomach ulcers, severe kidney disease, &/or actively taking a blood thinner  What if I still have pain? If you have pain that is not controlled with the over-the-counter pain medications (Tylenol and Motrin or Advil) you might have what we call "breakthrough" pain. You will receive a prescription for a small amount of an opioid pain medication such as Oxycodone, Tramadol, or Tylenol with Codeine. Use these opioid pills in the first 24 hours after surgery if you have breakthrough pain. Do   not take more than 1 pill every 4-6 hours.  If you still have uncontrolled pain after using all opioid pills, don't hesitate to call our staff using the number provided. We will help make sure you are managing your pain in the best way possible, and if necessary, we can provide a prescription for additional pain medication.   Day 1    Time  Name of Medication Number of pills taken  Amount of Acetaminophen  Pain Level   Comments  AM PM       AM PM       AM PM       AM PM       AM PM       AM PM       AM PM       AM PM       Total Daily amount of Acetaminophen Do not take more than  3,000 mg per day      Day 2    Time  Name of Medication Number of pills taken  Amount of Acetaminophen  Pain Level   Comments  AM PM       AM PM       AM PM       AM PM       AM PM       AM PM       AM  PM       AM PM       Total Daily amount of Acetaminophen Do not take more than  3,000 mg per day      Day 3    Time  Name of Medication Number of pills taken  Amount of Acetaminophen  Pain Level   Comments  AM PM       AM PM       AM PM       AM PM          AM PM       AM PM       AM PM       AM PM       Total Daily amount of Acetaminophen Do not take more than  3,000 mg per day      Day 4    Time  Name of Medication Number of pills taken  Amount of Acetaminophen  Pain Level   Comments  AM PM       AM PM       AM PM       AM PM       AM PM       AM PM       AM PM       AM PM       Total Daily amount of Acetaminophen Do not take more than  3,000 mg per day      Day 5    Time  Name of Medication Number of pills taken  Amount of Acetaminophen  Pain Level   Comments  AM PM       AM PM       AM PM       AM PM       AM PM       AM PM       AM PM       AM PM       Total Daily amount of Acetaminophen Do not take more than    3,000 mg per day       Day 6    Time  Name of Medication Number of pills taken  Amount of Acetaminophen  Pain Level  Comments  AM PM       AM PM       AM PM       AM PM       AM PM       AM PM       AM PM       AM PM       Total Daily amount of Acetaminophen Do not take more than  3,000 mg per day      Day 7    Time  Name of Medication Number of pills taken  Amount of Acetaminophen  Pain Level   Comments  AM PM       AM PM       AM PM       AM PM       AM PM       AM PM       AM PM       AM PM       Total Daily amount of Acetaminophen Do not take more than  3,000 mg per day        For additional information about how and where to safely dispose of unused opioid medications - https://www.morepowerfulnc.org  Disclaimer: This document contains information and/or instructional materials adapted from [facility] for the typical patient with your condition. It does not replace medical advice  from your health care provider because your experience may differ from that of the typical patient. Talk to your health care provider if you have any questions about this document, your condition or your treatment plan. Adapted from [facility]  

## 2022-05-02 ENCOUNTER — Encounter (HOSPITAL_COMMUNITY): Payer: Self-pay | Admitting: General Surgery

## 2022-05-03 ENCOUNTER — Encounter (HOSPITAL_COMMUNITY): Payer: Self-pay

## 2022-05-03 ENCOUNTER — Emergency Department (HOSPITAL_COMMUNITY): Payer: No Typology Code available for payment source

## 2022-05-03 ENCOUNTER — Other Ambulatory Visit: Payer: Self-pay

## 2022-05-03 ENCOUNTER — Inpatient Hospital Stay (HOSPITAL_COMMUNITY)
Admission: EM | Admit: 2022-05-03 | Discharge: 2022-05-10 | DRG: 393 | Disposition: A | Discharge status: Home / Self Care | Payer: No Typology Code available for payment source | Attending: Surgery | Admitting: Surgery

## 2022-05-03 DIAGNOSIS — Z820 Family history of epilepsy and other diseases of the nervous system: Secondary | ICD-10-CM

## 2022-05-03 DIAGNOSIS — Z91013 Allergy to seafood: Secondary | ICD-10-CM

## 2022-05-03 DIAGNOSIS — R0902 Hypoxemia: Secondary | ICD-10-CM | POA: Diagnosis not present

## 2022-05-03 DIAGNOSIS — Z8349 Family history of other endocrine, nutritional and metabolic diseases: Secondary | ICD-10-CM

## 2022-05-03 DIAGNOSIS — K9186 Retained cholelithiasis following cholecystectomy: Secondary | ICD-10-CM | POA: Diagnosis not present

## 2022-05-03 DIAGNOSIS — Z803 Family history of malignant neoplasm of breast: Secondary | ICD-10-CM

## 2022-05-03 DIAGNOSIS — K859 Acute pancreatitis without necrosis or infection, unspecified: Secondary | ICD-10-CM

## 2022-05-03 DIAGNOSIS — Y838 Other surgical procedures as the cause of abnormal reaction of the patient, or of later complication, without mention of misadventure at the time of the procedure: Secondary | ICD-10-CM | POA: Diagnosis not present

## 2022-05-03 DIAGNOSIS — Z818 Family history of other mental and behavioral disorders: Secondary | ICD-10-CM

## 2022-05-03 DIAGNOSIS — R1011 Right upper quadrant pain: Secondary | ICD-10-CM | POA: Diagnosis not present

## 2022-05-03 DIAGNOSIS — R7989 Other specified abnormal findings of blood chemistry: Secondary | ICD-10-CM | POA: Diagnosis present

## 2022-05-03 DIAGNOSIS — Z8041 Family history of malignant neoplasm of ovary: Secondary | ICD-10-CM

## 2022-05-03 DIAGNOSIS — J9811 Atelectasis: Secondary | ICD-10-CM | POA: Diagnosis not present

## 2022-05-03 DIAGNOSIS — R319 Hematuria, unspecified: Secondary | ICD-10-CM | POA: Diagnosis not present

## 2022-05-03 DIAGNOSIS — K9189 Other postprocedural complications and disorders of digestive system: Secondary | ICD-10-CM | POA: Diagnosis not present

## 2022-05-03 DIAGNOSIS — Z79899 Other long term (current) drug therapy: Secondary | ICD-10-CM

## 2022-05-03 DIAGNOSIS — Z91030 Bee allergy status: Secondary | ICD-10-CM

## 2022-05-03 DIAGNOSIS — K8000 Calculus of gallbladder with acute cholecystitis without obstruction: Secondary | ICD-10-CM | POA: Diagnosis present

## 2022-05-03 DIAGNOSIS — K567 Ileus, unspecified: Secondary | ICD-10-CM | POA: Diagnosis not present

## 2022-05-03 DIAGNOSIS — K8051 Calculus of bile duct without cholangitis or cholecystitis with obstruction: Secondary | ICD-10-CM

## 2022-05-03 DIAGNOSIS — Z8249 Family history of ischemic heart disease and other diseases of the circulatory system: Secondary | ICD-10-CM

## 2022-05-03 LAB — CBC WITH DIFFERENTIAL/PLATELET
Abs Immature Granulocytes: 0.02 10*3/uL (ref 0.00–0.07)
Basophils Absolute: 0 10*3/uL (ref 0.0–0.1)
Basophils Relative: 0 %
Eosinophils Absolute: 0.1 10*3/uL (ref 0.0–0.5)
Eosinophils Relative: 1 %
HCT: 38.1 % (ref 36.0–46.0)
Hemoglobin: 12.5 g/dL (ref 12.0–15.0)
Immature Granulocytes: 0 %
Lymphocytes Relative: 12 %
Lymphs Abs: 0.9 10*3/uL (ref 0.7–4.0)
MCH: 30.9 pg (ref 26.0–34.0)
MCHC: 32.8 g/dL (ref 30.0–36.0)
MCV: 94.1 fL (ref 80.0–100.0)
Monocytes Absolute: 0.7 10*3/uL (ref 0.1–1.0)
Monocytes Relative: 9 %
Neutro Abs: 5.8 10*3/uL (ref 1.7–7.7)
Neutrophils Relative %: 78 %
Platelets: 224 10*3/uL (ref 150–400)
RBC: 4.05 MIL/uL (ref 3.87–5.11)
RDW: 13.2 % (ref 11.5–15.5)
WBC: 7.5 10*3/uL (ref 4.0–10.5)
nRBC: 0 % (ref 0.0–0.2)

## 2022-05-03 LAB — COMPREHENSIVE METABOLIC PANEL
ALT: 512 U/L — ABNORMAL HIGH (ref 0–44)
AST: 165 U/L — ABNORMAL HIGH (ref 15–41)
Albumin: 3.8 g/dL (ref 3.5–5.0)
Alkaline Phosphatase: 191 U/L — ABNORMAL HIGH (ref 38–126)
Anion gap: 7 (ref 5–15)
BUN: 8 mg/dL (ref 6–20)
CO2: 25 mmol/L (ref 22–32)
Calcium: 9 mg/dL (ref 8.9–10.3)
Chloride: 107 mmol/L (ref 98–111)
Creatinine, Ser: 0.66 mg/dL (ref 0.44–1.00)
GFR, Estimated: >60 mL/min (ref >60)
Glucose, Bld: 164 mg/dL — ABNORMAL HIGH (ref 70–99)
Potassium: 3.5 mmol/L (ref 3.5–5.1)
Sodium: 139 mmol/L (ref 135–145)
Total Bilirubin: 4.2 mg/dL — ABNORMAL HIGH (ref 0.3–1.2)
Total Protein: 7.1 g/dL (ref 6.5–8.1)

## 2022-05-03 LAB — I-STAT BETA HCG BLOOD, ED (MC, WL, AP ONLY): I-stat hCG, quantitative: <5 m[IU]/mL (ref <5)

## 2022-05-03 LAB — LIPASE, BLOOD: Lipase: 68 U/L — ABNORMAL HIGH (ref 11–51)

## 2022-05-03 MED ORDER — GADOBUTROL 1 MMOL/ML IV SOLN
6.0000 mL | Freq: Once | INTRAVENOUS | Status: AC | PRN
Start: 2022-05-03 — End: 2022-05-03
  Administered 2022-05-03: 6 mL via INTRAVENOUS

## 2022-05-03 MED ORDER — ONDANSETRON 4 MG PO TBDP
4.0000 mg | ORAL_TABLET | Freq: Four times a day (QID) | ORAL | Status: DC | PRN
  Administered 2022-05-06: 4 mg via ORAL
  Filled 2022-05-03: qty 1

## 2022-05-03 MED ORDER — VENLAFAXINE HCL 75 MG PO TABS
75.0000 mg | ORAL_TABLET | Freq: Two times a day (BID) | ORAL | Status: DC
  Administered 2022-05-03 – 2022-05-10 (×12): 75 mg via ORAL
  Filled 2022-05-03 (×14): qty 1

## 2022-05-03 MED ORDER — ONDANSETRON HCL 4 MG/2ML IJ SOLN
4.0000 mg | Freq: Four times a day (QID) | INTRAMUSCULAR | Status: DC | PRN
  Administered 2022-05-03 – 2022-05-04 (×2): 4 mg via INTRAVENOUS
  Filled 2022-05-03 (×2): qty 2

## 2022-05-03 MED ORDER — DIPHENHYDRAMINE HCL 50 MG/ML IJ SOLN: 25.0000 mg | Freq: Four times a day (QID) | INTRAMUSCULAR | Status: DC | PRN

## 2022-05-03 MED ORDER — LACTATED RINGERS IV BOLUS
1000.0000 mL | Freq: Once | INTRAVENOUS | Status: AC
  Administered 2022-05-03: 1000 mL via INTRAVENOUS

## 2022-05-03 MED ORDER — HYDROMORPHONE HCL 1 MG/ML IJ SOLN
0.5000 mg | INTRAMUSCULAR | Status: DC | PRN
  Administered 2022-05-03: 0.5 mg via INTRAVENOUS
  Filled 2022-05-03: qty 1
  Filled 2022-05-03: qty 0.5

## 2022-05-03 MED ORDER — DOCUSATE SODIUM 100 MG PO CAPS
100.0000 mg | ORAL_CAPSULE | Freq: Two times a day (BID) | ORAL | Status: DC
Start: 2022-05-03 — End: 2022-05-10
  Administered 2022-05-03 – 2022-05-10 (×12): 100 mg via ORAL
  Filled 2022-05-03 (×12): qty 1

## 2022-05-03 MED ORDER — SODIUM CHLORIDE (PF) 0.9 % IJ SOLN
INTRAMUSCULAR | Status: AC
  Filled 2022-05-03: qty 50

## 2022-05-03 MED ORDER — IOHEXOL 300 MG/ML  SOLN
100.0000 mL | Freq: Once | INTRAMUSCULAR | Status: AC | PRN
  Administered 2022-05-03: 100 mL via INTRAVENOUS

## 2022-05-03 MED ORDER — DIPHENHYDRAMINE HCL 25 MG PO CAPS
25.0000 mg | ORAL_CAPSULE | Freq: Four times a day (QID) | ORAL | Status: DC | PRN
  Administered 2022-05-04 – 2022-05-05 (×2): 25 mg via ORAL
  Filled 2022-05-03 (×3): qty 1

## 2022-05-03 MED ORDER — POLYETHYLENE GLYCOL 3350 17 G PO PACK
17.0000 g | PACK | Freq: Every day | ORAL | Status: DC | PRN
  Administered 2022-05-06: 17 g via ORAL
  Filled 2022-05-03: qty 1

## 2022-05-03 MED ORDER — ENOXAPARIN SODIUM 40 MG/0.4ML IJ SOSY
40.0000 mg | PREFILLED_SYRINGE | INTRAMUSCULAR | Status: DC
  Administered 2022-05-03 – 2022-05-09 (×6): 40 mg via SUBCUTANEOUS
  Filled 2022-05-03 (×8): qty 0.4

## 2022-05-03 MED ORDER — HYDROMORPHONE HCL 1 MG/ML IJ SOLN
1.0000 mg | Freq: Once | INTRAMUSCULAR | Status: AC
  Administered 2022-05-03: 1 mg via INTRAVENOUS
  Filled 2022-05-03: qty 1

## 2022-05-03 MED ORDER — OXYCODONE HCL 5 MG PO TABS
5.0000 mg | ORAL_TABLET | ORAL | Status: DC | PRN
  Administered 2022-05-04 – 2022-05-09 (×7): 5 mg via ORAL
  Filled 2022-05-03 (×9): qty 1

## 2022-05-03 NOTE — Plan of Care (Signed)
Plan of care initiated and discussed with the patient. 

## 2022-05-03 NOTE — ED Triage Notes (Addendum)
Pt reports she had gallbladder surgery on Friday at [facility] , discharged Friday, ever since has had abdominal pain, lower back pain and nausea. Pt jaundice on observation

## 2022-05-03 NOTE — ED Notes (Signed)
Patient transported to CT 

## 2022-05-03 NOTE — H&P (Addendum)
Felicia Cole Dec 06, 1980  [id number].    Requesting MD: Dr. Regenia Skeeter Chief Complaint/Reason for Consult: abdominal pain, jaundice, recent cholecystectomy  HPI:  Felicia Cole is a 41 yo female who presents to the ED with abdominal pain and jaundice following a recent cholecystectomy. She presented on 6/2 with epigastric abdominal pain and was found to have gallstones with elevated transaminases and a Tbili of 1.5. She was taken to the OR that day for a laparoscopic cholecystectomy with attempted intraop cholangiogram, but there was persistent leakage from the catheter so full cholangiogram was not performed. The patient opted for discharge home later that day as she was feeling well, with plans to recheck labs this coming week.  Since discharge home, she has had worsening abdominal pain radiating to the back, and today noticed that she appeared jaundiced. She endorses chills but no fevers. In the ED, labs are significant for a bilirubin of 4.2. Alk phos is 191, and AST/ALT are 165/512 (compared to 625/368 two days ago). A CT scan shows intra and extrahepatic biliary ductal dilation but no intraabdominal free fluid or fluid collections. General surgery was consulted.  ROS: Review of Systems  Constitutional:  Positive for chills. Negative for fever.  Respiratory:  Negative for shortness of breath and stridor.   Cardiovascular:  Negative for chest pain.  Gastrointestinal:  Positive for abdominal pain. Negative for nausea and vomiting.  Musculoskeletal:  Positive for back pain.  Neurological:  Negative for seizures and weakness.   Family History  Problem Relation Age of Onset   Hypertension Mother    Anxiety disorder Mother    Thyroid disease Mother    Epilepsy Father    ALS Father    Cancer Maternal Aunt        breast   Heart disease Maternal Grandmother    Cancer Maternal Grandmother        breast   Heart disease Maternal Grandfather    Cancer Paternal Grandmother        ovarian    Heart disease Paternal Grandmother    Heart disease Paternal Grandfather    Hypertension Paternal Grandfather     Past Medical History:  Diagnosis Date   Allergy    Anxiety    Asthma    Herpesvirus 2    Vaginal delivery 2013, 2016X2   Vitamin D deficiency     Past Surgical History:  Procedure Laterality Date   BREAST SURGERY  04/13/2017   augmentation    CHOLECYSTECTOMY N/A 05/01/2022   Procedure: LAPAROSCOPIC CHOLECYSTECTOMY;  Surgeon: Mickeal Skinner, MD;  Location: [facility];  Service: General;  Laterality: N/A;   INTRAOPERATIVE CHOLANGIOGRAM N/A 05/01/2022   Procedure: ATTEMPTED INTRAOPERATIVE CHOLANGIOGRAM;  Surgeon: Kieth Brightly Arta Bruce, MD;  Location: [facility];  Service: General;  Laterality: N/A;   LAPAROSCOPIC TUBAL LIGATION Bilateral 11/15/2015   Procedure: LAPAROSCOPIC TUBAL LIGATION;  Surgeon: Brien Few, MD;  Location: [facility];  Service: Gynecology;  Laterality: Bilateral;   tummy tuck  04/16/2017    Social History:  reports that she has never smoked. She has never used smokeless tobacco. She reports that she does not drink alcohol and does not use drugs.  Allergies:  Allergies  Allergen Reactions   Bee Venom Anaphylaxis    Patient carries epipen   Shellfish Allergy Anaphylaxis    Patient carries epipen    (Not in a hospital admission)    Physical Exam: Blood pressure 102/70, pulse 78, temperature 98.7 F (37.1 C), temperature source Oral, resp. rate 16,  height '5\' 1"'  (1.549 m), weight 63.5 kg, SpO2 99 %. General: resting comfortably, appears stated age, no apparent distress Neurological: alert and oriented, no focal deficits, cranial nerves grossly in tact HEENT: normocephalic, atraumatic, scleral icterus noted CV: regular rate and rhythm extremities warm and well-perfused Respiratory: normal work of breathing on room air, symmetric chest wall expansion Abdomen: soft, nondistended, minimally tender. Incisions clean and dry. Extremities: warm and  well-perfused, no deformities, moving all extremities spontaneously Psychiatric: normal mood and affect Skin: warm and dry, jaundiced   Results for orders placed or performed during the hospital encounter of 05/03/22 (from the past 48 hour(s))  Comprehensive metabolic panel     Status: Abnormal   Collection Time: 05/03/22  5:50 PM  Result Value Ref Range   Sodium 139 135 - 145 mmol/L   Potassium 3.5 3.5 - 5.1 mmol/L   Chloride 107 98 - 111 mmol/L   CO2 25 22 - 32 mmol/L   Glucose, Bld 164 (H) 70 - 99 mg/dL    Comment: Glucose reference range applies only to samples taken after fasting for at least 8 hours.   BUN 8 6 - 20 mg/dL   Creatinine, Ser 0.66 0.44 - 1.00 mg/dL   Calcium 9.0 8.9 - 10.3 mg/dL   Total Protein 7.1 6.5 - 8.1 g/dL   Albumin 3.8 3.5 - 5.0 g/dL   AST 165 (H) 15 - 41 U/L   ALT 512 (H) 0 - 44 U/L   Alkaline Phosphatase 191 (H) 38 - 126 U/L   Total Bilirubin 4.2 (H) 0.3 - 1.2 mg/dL   GFR, Estimated >60 >60 mL/min    Comment: (NOTE) Calculated using the CKD-EPI Creatinine Equation (2021)    Anion gap 7 5 - 15    Comment: Performed at [facility], [place] [address] [place], [place] [address]  Lipase, blood     Status: Abnormal   Collection Time: 05/03/22  5:50 PM  Result Value Ref Range   Lipase 68 (H) 11 - 51 U/L    Comment: Performed at [facility], [place] [address] [place], [place] [address]  CBC with Differential     Status: None   Collection Time: 05/03/22  5:50 PM  Result Value Ref Range   WBC 7.5 4.0 - 10.5 K/uL   RBC 4.05 3.87 - 5.11 MIL/uL   Hemoglobin 12.5 12.0 - 15.0 g/dL   HCT 38.1 36.0 - 46.0 %   MCV 94.1 80.0 - 100.0 fL   MCH 30.9 26.0 - 34.0 pg   MCHC 32.8 30.0 - 36.0 g/dL   RDW 13.2 11.5 - 15.5 %   Platelets 224 150 - 400 K/uL   nRBC 0.0 0.0 - 0.2 %   Neutrophils Relative % 78 %   Neutro Abs 5.8 1.7 - 7.7 K/uL   Lymphocytes Relative 12 %   Lymphs Abs 0.9 0.7 - 4.0 K/uL   Monocytes Relative 9 %    Monocytes Absolute 0.7 0.1 - 1.0 K/uL   Eosinophils Relative 1 %   Eosinophils Absolute 0.1 0.0 - 0.5 K/uL   Basophils Relative 0 %   Basophils Absolute 0.0 0.0 - 0.1 K/uL   Immature Granulocytes 0 %   Abs Immature Granulocytes 0.02 0.00 - 0.07 K/uL    Comment: Performed at [facility], [place] [address], [place], [place] [address]  I-Stat beta hCG blood, ED     Status: None   Collection Time: 05/03/22  6:03 PM  Result  Value Ref Range   I-stat hCG, quantitative <5.0 <5 mIU/mL   Comment 3            Comment:   GEST. AGE      CONC.  (mIU/mL)   <=1 WEEK        5 - 50     2 WEEKS       50 - 500     3 WEEKS       100 - 10,000     4 WEEKS     1,000 - 30,000        FEMALE AND NON-PREGNANT FEMALE:     LESS THAN 5 mIU/mL    CT ABDOMEN PELVIS W CONTRAST  Result Date: 05/03/2022 CLINICAL DATA:  Abdominal pain, post-op EXAM: CT ABDOMEN AND PELVIS WITH CONTRAST TECHNIQUE: Multidetector CT imaging of the abdomen and pelvis was performed using the standard protocol following bolus administration of intravenous contrast. RADIATION DOSE REDUCTION: This exam was performed according to the departmental dose-optimization program which includes automated exposure control, adjustment of the mA and/or kV according to patient size and/or use of iterative reconstruction technique. CONTRAST:  150m OMNIPAQUE IOHEXOL 300 MG/ML  SOLN COMPARISON:  MRI pelvis 05/12/2019 FINDINGS: Lower chest: Bilateral lower lobe atelectasis versus scarring. Bilateral breast implants noted. Hepatobiliary: No focal liver abnormality. Status post cholecystectomy. Expected trace stranding within the gallbladder fossa surgical bed. Mild intra and extrahepatic biliary ductal dilatation which can be seen in the post cholecystectomy setting. The common bile duct is noted to smoothly taper to normal caliber. Pancreas: No focal lesion. Normal pancreatic contour. No surrounding inflammatory changes. No main pancreatic ductal  dilatation. Spleen: Normal in size without focal abnormality. Adrenals/Urinary Tract: No adrenal nodule bilaterally. Bilateral kidneys enhance symmetrically. No hydronephrosis. No hydroureter. The urinary bladder is unremarkable. Stomach/Bowel: Stomach is within normal limits. No evidence of bowel wall thickening or dilatation. Appendix appears normal. Vascular/Lymphatic: The main portal, splenic, superior mesenteric vein are patent. No abdominal aorta or iliac aneurysm. No abdominal, pelvic, or inguinal lymphadenopathy. Reproductive: Left ovarian corpus luteum cyst. Uterus and bilateral adnexa are unremarkable. Other: No intraperitoneal free fluid. No intraperitoneal free gas. No organized fluid collection. Musculoskeletal: Trace subcutaneus soft tissue edema along the right upper quadrant soft tissues and abdominal muscles likely postsurgical. No suspicious lytic or blastic osseous lesions. No acute displaced fracture. IMPRESSION: Postsurgical changes with no acute intra-abdominal or intrapelvic abnormality in a patient status post cholecystectomy. Electronically Signed   By: MIven FinnM.D.   On: 05/03/2022 18:59      Assessment/Plan 41yo female presenting 2 days after a lap chole with abdominal pain and jaundice. I personally reviewed her CT and MRCP. CT shows biliary ductal dilation, but no collections in the gallbladder fossa or intraabdominal free fluid to suggest a bile leak. This is most consistent with biliary obstruction. On my review of her MRCP there are is smooth tapering of the distal CBD but no obvious large filling defects. There are possible multiple small stones in the distal CBD on final read. There are no signs of acute pancreatitis. - Pain and nausea control - Trend LFTs - GI consult for ERCP - Regular diet, NPO at midnight for possible procedure tomorrow - VTE: lovenox, SCDs - Dispo: admit, med-surg floor   SMichaelle Birks [facility] General, Hepatobiliary  and Pancreatic Surgery 05/03/22 7:58 PM

## 2022-05-03 NOTE — ED Provider Notes (Signed)
[facility] DEPT Provider Note   CSN: [id number] Arrival date & time: 05/03/22  1714     History  Chief Complaint  Patient presents with   Post-op Problem   Abdominal Pain    Felicia Cole is a 41 y.o. female.  HPI 41 year old female presents with abdominal pain.  On 6/2 she had a laparoscopic cholecystectomy for cholecystitis.  Cholangiogram was unable to be fully performed.  She was doing better that afternoon and was discharged.  She was doing okay until yesterday morning/early afternoon and the pain has recurred.  It is the same pain she was having but more severe.  There is pain in her right upper quadrant, epigastrium and back.  She is had some nausea as well.  NSAIDs, Tylenol, and oxycodone are not helping.  ER scheduled  Home Medications Prior to Admission medications   Medication Sig Start Date End Date Taking? Authorizing Provider  cetirizine (ZYRTEC) 10 MG tablet Take 10 mg by mouth at bedtime.   Yes [provider]  Famotidine (PEPCID PO) Take 1 tablet by mouth daily as needed (for reflux).   Yes [provider]  ibuprofen (ADVIL) 800 MG tablet Take 1 tablet (800 mg total) by mouth every 8 (eight) hours as needed. Patient taking differently: Take 800 mg by mouth every 8 (eight) hours as needed (for pain). 05/01/22  Yes Kinsinger, Arta Bruce, MD  oxyCODONE (OXY IR/ROXICODONE) 5 MG immediate release tablet Take 1 tablet (5 mg total) by mouth every 6 (six) hours as needed for severe pain. 05/01/22  Yes Kinsinger, Arta Bruce, MD  venlafaxine (EFFEXOR) 75 MG tablet Take 75 mg by mouth in the morning and at bedtime.   Yes [provider]  EPINEPHrine 0.3 mg/0.3 mL IJ SOAJ injection Inject 0.3 mLs (0.3 mg total) into the muscle once. Patient not taking: Reported on 05/03/2022 12/26/15   Gregor Hams, MD  venlafaxine XR (EFFEXOR-XR) 75 MG 24 hr capsule Take 1 capsule (75 mg total) by mouth daily with breakfast. Patient not  taking: Reported on 05/03/2022 05/09/19   Emeterio Reeve, DO      Allergies    Bee venom and Shellfish allergy    Review of Systems   Review of Systems  Constitutional:  Negative for fever.  Gastrointestinal:  Positive for abdominal pain and nausea. Negative for vomiting.   Physical Exam Updated Vital Signs BP 116/70   Pulse 65   Temp 98.8 F (37.1 C)   Resp 20   Ht '5\' 1"'$  (1.549 m)   Wt 63.5 kg   SpO2 99%   BMI 26.45 kg/m  Physical Exam Vitals and nursing note reviewed.  Constitutional:      Appearance: She is well-developed.  HENT:     Head: Normocephalic and atraumatic.  Cardiovascular:     Rate and Rhythm: Normal rate and regular rhythm.     Heart sounds: Normal heart sounds.  Pulmonary:     Effort: Pulmonary effort is normal.     Breath sounds: Normal breath sounds.  Abdominal:     Palpations: Abdomen is soft.     Tenderness: There is abdominal tenderness in the right upper quadrant and epigastric area.     Comments: No dehiscence from laparoscopy incisions  Skin:    General: Skin is warm and dry.  Neurological:     Mental Status: She is alert.    ED Results / Procedures / Treatments   Labs (all labs ordered are listed, but only  abnormal results are displayed) Labs Reviewed  COMPREHENSIVE METABOLIC PANEL - Abnormal; Notable for the following components:      Result Value   Glucose, Bld 164 (*)    AST 165 (*)    ALT 512 (*)    Alkaline Phosphatase 191 (*)    Total Bilirubin 4.2 (*)    All other components within normal limits  LIPASE, BLOOD - Abnormal; Notable for the following components:   Lipase 68 (*)    All other components within normal limits  CBC WITH DIFFERENTIAL/PLATELET  COMPREHENSIVE METABOLIC PANEL  I-STAT BETA HCG BLOOD, ED ([facility], [facility], [facility] ONLY)    EKG None  Radiology CT ABDOMEN PELVIS W CONTRAST  Result Date: 05/03/2022 CLINICAL DATA:  Abdominal pain, post-op EXAM: CT ABDOMEN AND PELVIS WITH CONTRAST TECHNIQUE: Multidetector CT  imaging of the abdomen and pelvis was performed using the standard protocol following bolus administration of intravenous contrast. RADIATION DOSE REDUCTION: This exam was performed according to the departmental dose-optimization program which includes automated exposure control, adjustment of the mA and/or kV according to patient size and/or use of iterative reconstruction technique. CONTRAST:  193m OMNIPAQUE IOHEXOL 300 MG/ML  SOLN COMPARISON:  MRI pelvis 05/12/2019 FINDINGS: Lower chest: Bilateral lower lobe atelectasis versus scarring. Bilateral breast implants noted. Hepatobiliary: No focal liver abnormality. Status post cholecystectomy. Expected trace stranding within the gallbladder fossa surgical bed. Mild intra and extrahepatic biliary ductal dilatation which can be seen in the post cholecystectomy setting. The common bile duct is noted to smoothly taper to normal caliber. Pancreas: No focal lesion. Normal pancreatic contour. No surrounding inflammatory changes. No main pancreatic ductal dilatation. Spleen: Normal in size without focal abnormality. Adrenals/Urinary Tract: No adrenal nodule bilaterally. Bilateral kidneys enhance symmetrically. No hydronephrosis. No hydroureter. The urinary bladder is unremarkable. Stomach/Bowel: Stomach is within normal limits. No evidence of bowel wall thickening or dilatation. Appendix appears normal. Vascular/Lymphatic: The main portal, splenic, superior mesenteric vein are patent. No abdominal aorta or iliac aneurysm. No abdominal, pelvic, or inguinal lymphadenopathy. Reproductive: Left ovarian corpus luteum cyst. Uterus and bilateral adnexa are unremarkable. Other: No intraperitoneal free fluid. No intraperitoneal free gas. No organized fluid collection. Musculoskeletal: Trace subcutaneus soft tissue edema along the right upper quadrant soft tissues and abdominal muscles likely postsurgical. No suspicious lytic or blastic osseous lesions. No acute displaced fracture.  IMPRESSION: Postsurgical changes with no acute intra-abdominal or intrapelvic abnormality in a patient status post cholecystectomy. Electronically Signed   By: MIven FinnM.D.   On: 05/03/2022 18:59   MR 3D Recon At Scanner  Result Date: 05/03/2022 CLINICAL DATA:  Abdominal pain after cholecystectomy. Jaundice. Postop day 2. EXAM: MRI ABDOMEN WITHOUT AND WITH CONTRAST (INCLUDING MRCP) TECHNIQUE: Multiplanar multisequence MR imaging of the abdomen was performed both before and after the administration of intravenous contrast. Heavily T2-weighted images of the biliary and pancreatic ducts were obtained, and three-dimensional MRCP images were rendered by post processing. CONTRAST:  62mGADAVIST GADOBUTROL 1 MMOL/ML IV SOLN COMPARISON:  CT 05/03/2022.  Ultrasound 04/30/2022. FINDINGS: Portions of exam are mildly motion degraded. Lower chest: Trace right pleural fluid. Bibasilar atelectasis. Bilateral breast implants. Hepatobiliary: Normal noncontrast appearance of the liver. Cholecystectomy. No postoperative fluid collection. Mild intrahepatic biliary duct dilatation for prior cholecystectomy. Example left hepatic duct of 5 mm on 28/5. The common duct measures 9 mm in the porta hepatis and tapers gradually above the ampulla. Within the dependent distal common duct, subtle T2 hypointensity which is suspicious for multiple tiny stones. Only readily apparent on  image 22/3 and image 50/13. Pancreas:  Normal, without mass or ductal dilatation. Spleen:  Normal in size, without focal abnormality. Adrenals/Urinary Tract: Normal adrenal glands. Normal kidneys, without hydronephrosis. Stomach/Bowel: Normal stomach and abdominal bowel loops. Vascular/Lymphatic: Normal caliber of the aorta and branch vessels. No retroperitoneal or retrocrural adenopathy. Other: No abdominal ascites. Again identified is right-sided abdominal wall edema which is likely postoperative. No well-defined fluid collection. Musculoskeletal: No acute  osseous abnormality. IMPRESSION: 1. Status post cholecystectomy with mild intrahepatic and borderline extrahepatic common duct dilatation. Suspicion of multiple tiny dependent distal common duct stones. Consider further evaluation with ERCP. 2. No postoperative fluid collection or evidence of pancreatitis. 3. Trace right pleural fluid and bibasilar atelectasis. Electronically Signed   By: Abigail Miyamoto M.D.   On: 05/03/2022 20:23   MR ABDOMEN MRCP W WO CONTAST  Result Date: 05/03/2022 CLINICAL DATA:  Abdominal pain after cholecystectomy. Jaundice. Postop day 2. EXAM: MRI ABDOMEN WITHOUT AND WITH CONTRAST (INCLUDING MRCP) TECHNIQUE: Multiplanar multisequence MR imaging of the abdomen was performed both before and after the administration of intravenous contrast. Heavily T2-weighted images of the biliary and pancreatic ducts were obtained, and three-dimensional MRCP images were rendered by post processing. CONTRAST:  35m GADAVIST GADOBUTROL 1 MMOL/ML IV SOLN COMPARISON:  CT 05/03/2022.  Ultrasound 04/30/2022. FINDINGS: Portions of exam are mildly motion degraded. Lower chest: Trace right pleural fluid. Bibasilar atelectasis. Bilateral breast implants. Hepatobiliary: Normal noncontrast appearance of the liver. Cholecystectomy. No postoperative fluid collection. Mild intrahepatic biliary duct dilatation for prior cholecystectomy. Example left hepatic duct of 5 mm on 28/5. The common duct measures 9 mm in the porta hepatis and tapers gradually above the ampulla. Within the dependent distal common duct, subtle T2 hypointensity which is suspicious for multiple tiny stones. Only readily apparent on image 22/3 and image 50/13. Pancreas:  Normal, without mass or ductal dilatation. Spleen:  Normal in size, without focal abnormality. Adrenals/Urinary Tract: Normal adrenal glands. Normal kidneys, without hydronephrosis. Stomach/Bowel: Normal stomach and abdominal bowel loops. Vascular/Lymphatic: Normal caliber of the aorta and  branch vessels. No retroperitoneal or retrocrural adenopathy. Other: No abdominal ascites. Again identified is right-sided abdominal wall edema which is likely postoperative. No well-defined fluid collection. Musculoskeletal: No acute osseous abnormality. IMPRESSION: 1. Status post cholecystectomy with mild intrahepatic and borderline extrahepatic common duct dilatation. Suspicion of multiple tiny dependent distal common duct stones. Consider further evaluation with ERCP. 2. No postoperative fluid collection or evidence of pancreatitis. 3. Trace right pleural fluid and bibasilar atelectasis. Electronically Signed   By: KAbigail MiyamotoM.D.   On: 05/03/2022 20:23    Procedures Procedures    Medications Ordered in ED Medications  sodium chloride (PF) 0.9 % injection (has no administration in time range)  enoxaparin (LOVENOX) injection 40 mg (40 mg Subcutaneous Given 05/03/22 2116)  oxyCODONE (Oxy IR/ROXICODONE) immediate release tablet 5 mg (has no administration in time range)  HYDROmorphone (DILAUDID) injection 0.5 mg (0.5 mg Intravenous Given 05/03/22 2115)  diphenhydrAMINE (BENADRYL) capsule 25 mg (has no administration in time range)    Or  diphenhydrAMINE (BENADRYL) injection 25 mg (has no administration in time range)  docusate sodium (COLACE) capsule 100 mg (100 mg Oral Given 05/03/22 2111)  ondansetron (ZOFRAN-ODT) disintegrating tablet 4 mg ( Oral See Alternative 05/03/22 2112)    Or  ondansetron (ZOFRAN) injection 4 mg (4 mg Intravenous Given 05/03/22 2112)  venlafaxine (EFFEXOR) tablet 75 mg (75 mg Oral Given 05/03/22 2200)  polyethylene glycol (MIRALAX / GLYCOLAX) packet 17 g (has no administration  in time range)  lactated ringers bolus 1,000 mL (1,000 mLs Intravenous New Bag/Given 05/03/22 1754)  HYDROmorphone (DILAUDID) injection 1 mg (1 mg Intravenous Given 05/03/22 1750)  iohexol (OMNIPAQUE) 300 MG/ML solution 100 mL (100 mLs Intravenous Contrast Given 05/03/22 1837)  gadobutrol (GADAVIST) 1  MMOL/ML injection 6 mL (6 mLs Intravenous Contrast Given 05/03/22 1955)    ED Course/ Medical Decision Making/ A&P                           Medical Decision Making Amount and/or Complexity of Data Reviewed Labs: ordered. Radiology: ordered and independent interpretation performed.  Risk Prescription drug management. Decision regarding hospitalization.   Patient was given IV Dilaudid for her acute, severe pain.  Labs obtained which show a normal WBC but her bilirubin is up to 4.2.  Other LFTs are abnormal but similar to when she was here couple days ago.  Lipase is slightly up but not consistent with pancreatitis.  I discussed with Dr. Zenia Resides of general surgery and she recommends CT would be the best initial imaging.  Does seem to have a CBD dilation though no obvious fluid collection.  I personally reviewed/interpreted these images.  MRCP was also ordered and she will be admitted to general surgery for further monitoring of her LFTs.        Final Clinical Impression(s) / ED Diagnoses Final diagnoses:  Hyperbilirubinemia    Rx / DC Orders ED Discharge Orders     None         Sherwood Gambler, MD 05/03/22 2256

## 2022-05-04 ENCOUNTER — Observation Stay (HOSPITAL_BASED_OUTPATIENT_CLINIC_OR_DEPARTMENT_OTHER): Payer: No Typology Code available for payment source | Admitting: Anesthesiology

## 2022-05-04 ENCOUNTER — Encounter (HOSPITAL_COMMUNITY): Admission: EM | Disposition: A | Discharge status: Home / Self Care | Payer: Self-pay | Source: Home / Self Care

## 2022-05-04 ENCOUNTER — Observation Stay (HOSPITAL_COMMUNITY): Payer: No Typology Code available for payment source

## 2022-05-04 ENCOUNTER — Observation Stay (HOSPITAL_COMMUNITY): Payer: No Typology Code available for payment source | Admitting: Anesthesiology

## 2022-05-04 ENCOUNTER — Encounter (HOSPITAL_COMMUNITY): Payer: Self-pay

## 2022-05-04 DIAGNOSIS — K8050 Calculus of bile duct without cholangitis or cholecystitis without obstruction: Secondary | ICD-10-CM | POA: Diagnosis not present

## 2022-05-04 HISTORY — PX: PANCREATIC STENT PLACEMENT: SHX5539

## 2022-05-04 HISTORY — PX: ERCP: SHX5425

## 2022-05-04 HISTORY — PX: REMOVAL OF STONES: SHX5545

## 2022-05-04 HISTORY — PX: SPHINCTEROTOMY: SHX5544

## 2022-05-04 LAB — COMPREHENSIVE METABOLIC PANEL
ALT: 416 U/L — ABNORMAL HIGH (ref 0–44)
AST: 125 U/L — ABNORMAL HIGH (ref 15–41)
Albumin: 3.6 g/dL (ref 3.5–5.0)
Alkaline Phosphatase: 170 U/L — ABNORMAL HIGH (ref 38–126)
Anion gap: 6 (ref 5–15)
BUN: 6 mg/dL (ref 6–20)
CO2: 24 mmol/L (ref 22–32)
Calcium: 8.8 mg/dL — ABNORMAL LOW (ref 8.9–10.3)
Chloride: 107 mmol/L (ref 98–111)
Creatinine, Ser: 0.52 mg/dL (ref 0.44–1.00)
GFR, Estimated: >60 mL/min (ref >60)
Glucose, Bld: 105 mg/dL — ABNORMAL HIGH (ref 70–99)
Potassium: 3.5 mmol/L (ref 3.5–5.1)
Sodium: 137 mmol/L (ref 135–145)
Total Bilirubin: 4.1 mg/dL — ABNORMAL HIGH (ref 0.3–1.2)
Total Protein: 6.8 g/dL (ref 6.5–8.1)

## 2022-05-04 LAB — GLUCOSE, CAPILLARY: Glucose-Capillary: 106 mg/dL — ABNORMAL HIGH (ref 70–99)

## 2022-05-04 LAB — SURGICAL PATHOLOGY

## 2022-05-04 SURGERY — ERCP, WITH INTERVENTION IF INDICATED
Anesthesia: General

## 2022-05-04 MED ORDER — LACTATED RINGERS IV SOLN: INTRAVENOUS | Status: DC

## 2022-05-04 MED ORDER — DICLOFENAC SUPPOSITORY 100 MG
RECTAL | Status: AC
  Filled 2022-05-04: qty 1

## 2022-05-04 MED ORDER — ROCURONIUM BROMIDE 10 MG/ML (PF) SYRINGE
PREFILLED_SYRINGE | INTRAVENOUS | Status: DC | PRN
  Administered 2022-05-04: 50 mg via INTRAVENOUS

## 2022-05-04 MED ORDER — SUCCINYLCHOLINE CHLORIDE 200 MG/10ML IV SOSY
PREFILLED_SYRINGE | INTRAVENOUS | Status: DC | PRN
  Administered 2022-05-04: 120 mg via INTRAVENOUS

## 2022-05-04 MED ORDER — ONDANSETRON HCL 4 MG/2ML IJ SOLN
INTRAMUSCULAR | Status: DC | PRN
  Administered 2022-05-04: 4 mg via INTRAVENOUS

## 2022-05-04 MED ORDER — GLUCAGON HCL RDNA (DIAGNOSTIC) 1 MG IJ SOLR
INTRAMUSCULAR | Status: AC
  Filled 2022-05-04: qty 1

## 2022-05-04 MED ORDER — INDOMETHACIN 50 MG RE SUPP
RECTAL | Status: DC | PRN
  Administered 2022-05-04: 100 mg via RECTAL

## 2022-05-04 MED ORDER — FENTANYL CITRATE (PF) 100 MCG/2ML IJ SOLN
INTRAMUSCULAR | Status: AC
  Filled 2022-05-04: qty 2

## 2022-05-04 MED ORDER — PROPOFOL 10 MG/ML IV BOLUS
INTRAVENOUS | Status: DC | PRN
  Administered 2022-05-04: 150 mg via INTRAVENOUS

## 2022-05-04 MED ORDER — SODIUM CHLORIDE 0.9 % IV SOLN
INTRAVENOUS | Status: DC | PRN
  Administered 2022-05-04: 20 mL

## 2022-05-04 MED ORDER — MIDAZOLAM HCL 2 MG/2ML IJ SOLN
INTRAMUSCULAR | Status: AC
  Filled 2022-05-04: qty 2

## 2022-05-04 MED ORDER — CIPROFLOXACIN IN D5W 400 MG/200ML IV SOLN
INTRAVENOUS | Status: AC
  Filled 2022-05-04: qty 200

## 2022-05-04 MED ORDER — SUGAMMADEX SODIUM 200 MG/2ML IV SOLN
INTRAVENOUS | Status: DC | PRN
  Administered 2022-05-04: 200 mg via INTRAVENOUS

## 2022-05-04 MED ORDER — SODIUM CHLORIDE 0.9 % IV SOLN: INTRAVENOUS | Status: DC

## 2022-05-04 MED ORDER — DEXAMETHASONE SODIUM PHOSPHATE 10 MG/ML IJ SOLN
INTRAMUSCULAR | Status: DC | PRN
  Administered 2022-05-04: 10 mg via INTRAVENOUS

## 2022-05-04 MED ORDER — MIDAZOLAM HCL 5 MG/5ML IJ SOLN
INTRAMUSCULAR | Status: DC | PRN
  Administered 2022-05-04: 2 mg via INTRAVENOUS

## 2022-05-04 MED ORDER — CIPROFLOXACIN IN D5W 400 MG/200ML IV SOLN
INTRAVENOUS | Status: DC | PRN
  Administered 2022-05-04: 400 mg via INTRAVENOUS

## 2022-05-04 MED ORDER — LIDOCAINE 2% (20 MG/ML) 5 ML SYRINGE
INTRAMUSCULAR | Status: DC | PRN
  Administered 2022-05-04: 60 mg via INTRAVENOUS

## 2022-05-04 MED ORDER — INDOMETHACIN 50 MG RE SUPP
RECTAL | Status: AC
  Filled 2022-05-04: qty 2

## 2022-05-04 MED ORDER — FENTANYL CITRATE (PF) 250 MCG/5ML IJ SOLN
INTRAMUSCULAR | Status: DC | PRN
  Administered 2022-05-04 (×2): 50 ug via INTRAVENOUS

## 2022-05-04 MED ORDER — AMISULPRIDE (ANTIEMETIC) 5 MG/2ML IV SOLN: 10.0000 mg | Freq: Once | INTRAVENOUS | Status: DC | PRN

## 2022-05-04 NOTE — Transfer of Care (Signed)
Immediate Anesthesia Transfer of Care Note  Patient: Felicia Cole  Procedure(s) Performed: ENDOSCOPIC RETROGRADE CHOLANGIOPANCREATOGRAPHY (ERCP) PANCREATIC STENT PLACEMENT SPHINCTEROTOMY REMOVAL OF STONES  Patient Location: PACU  Anesthesia Type:General  Level of Consciousness: sedated  Airway & Oxygen Therapy: Patient Spontanous Breathing and Patient connected to face mask oxygen  Post-op Assessment: Report given to RN and Post -op Vital signs reviewed and stable  Post vital signs: Reviewed and stable  Last Vitals:  Vitals Value Taken Time  BP    Temp    Pulse    Resp    SpO2      Last Pain:  Vitals:   05/04/22 0827  TempSrc: Temporal  PainSc: 4       Patients Stated Pain Goal: 0 (09/25/24 3664)  Complications: No notable events documented.

## 2022-05-04 NOTE — Anesthesia Preprocedure Evaluation (Signed)
Anesthesia Evaluation  Patient identified by MRN, date of birth, ID band Patient awake    Reviewed: Allergy & Precautions, NPO status , Patient's Chart, lab work & pertinent test results  Airway Mallampati: II  TM Distance: >3 FB Neck ROM: Full    Dental no notable dental hx.    Pulmonary asthma ,    Pulmonary exam normal breath sounds clear to auscultation       Cardiovascular Exercise Tolerance: Good negative cardio ROS Normal cardiovascular exam Rhythm:Regular Rate:Normal     Neuro/Psych PSYCHIATRIC DISORDERS Anxiety negative neurological ROS     GI/Hepatic negative GI ROS, Elevated LFT's Choledocholithiasis in setting of recent Laparoscopic cholecystectomy   Endo/Other  negative endocrine ROSHx/o prolactinoma Hx/o Vit. D def  Renal/GU negative Renal ROS  negative genitourinary   Musculoskeletal negative musculoskeletal ROS (+)   Abdominal   Peds negative pediatric ROS (+)  Hematology negative hematology ROS (+)   Anesthesia Other Findings   Reproductive/Obstetrics negative OB ROS HSV                             Anesthesia Physical  Anesthesia Plan  ASA: 2  Anesthesia Plan: General   Post-op Pain Management: Tylenol PO (pre-op)*   Induction: Intravenous and Cricoid pressure planned  PONV Risk Score and Plan: 3 and Treatment may vary due to age or medical condition, Ondansetron, Dexamethasone, Scopolamine patch - Pre-op and Midazolam  Airway Management Planned: Oral ETT  Additional Equipment: None  Intra-op Plan:   Post-operative Plan: Extubation in OR  Informed Consent: I have reviewed the patients History and Physical, chart, labs and discussed the procedure including the risks, benefits and alternatives for the proposed anesthesia with the patient or authorized representative who has indicated his/her understanding and acceptance.     Dental advisory  given  Plan Discussed with: CRNA, Anesthesiologist and Surgeon  Anesthesia Plan Comments:         Anesthesia Quick Evaluation

## 2022-05-04 NOTE — Progress Notes (Signed)
Progress Note  Day of Surgery  Subjective: Pt seen after ERCP. Reports she is feeling much better. Denies nausea. Husband at bedside.   Objective: Vital signs in last 24 hours: Temp:  [97.3 F (36.3 C)-98.9 F (37.2 C)] 98.5 F (36.9 C) (06/05 1030) Pulse Rate:  [65-80] 66 (06/05 1050) Resp:  [13-20] 13 (06/05 1050) BP: (100-116)/(56-81) 103/61 (06/05 1050) SpO2:  [96 %-100 %] 98 % (06/05 1050) Weight:  [63.5 kg] 63.5 kg (06/04 1727) Last BM Date : 05/03/22  Intake/Output from previous day: No intake/output data recorded. Intake/Output this shift: Total I/O In: 500 [I.V.:500] Out: 0   PE: General: pleasant, WD, WN female who is laying in bed in NAD HEENT: Sclera are mildly icteric Lungs: Respiratory effort nonlabored Abd: soft, NT, ND, incisions C/D/I Skin: jaundiced Psych: A&Ox3 with an appropriate affect.    Lab Results:  Recent Labs    05/03/22 1750  WBC 7.5  HGB 12.5  HCT 38.1  PLT 224   BMET Recent Labs    05/03/22 1750 05/04/22 0335  NA 139 137  K 3.5 3.5  CL 107 107  CO2 25 24  GLUCOSE 164* 105*  BUN 8 6  CREATININE 0.66 0.52  CALCIUM 9.0 8.8*   PT/INR No results for input(s): LABPROT, INR in the last 72 hours. CMP     Component Value Date/Time   NA 137 05/04/2022 0335   K 3.5 05/04/2022 0335   CL 107 05/04/2022 0335   CO2 24 05/04/2022 0335   GLUCOSE 105 (H) 05/04/2022 0335   BUN 6 05/04/2022 0335   CREATININE 0.52 05/04/2022 0335   CREATININE 0.83 05/11/2019 0846   CALCIUM 8.8 (L) 05/04/2022 0335   PROT 6.8 05/04/2022 0335   ALBUMIN 3.6 05/04/2022 0335   AST 125 (H) 05/04/2022 0335   ALT 416 (H) 05/04/2022 0335   ALKPHOS 170 (H) 05/04/2022 0335   BILITOT 4.1 (H) 05/04/2022 0335   GFRNONAA >60 05/04/2022 0335   GFRNONAA 90 05/11/2019 0846   GFRAA 104 05/11/2019 0846   Lipase     Component Value Date/Time   LIPASE 68 (H) 05/03/2022 1750       Studies/Results: CT ABDOMEN PELVIS W CONTRAST  Result Date:  05/03/2022 CLINICAL DATA:  Abdominal pain, post-op EXAM: CT ABDOMEN AND PELVIS WITH CONTRAST TECHNIQUE: Multidetector CT imaging of the abdomen and pelvis was performed using the standard protocol following bolus administration of intravenous contrast. RADIATION DOSE REDUCTION: This exam was performed according to the departmental dose-optimization program which includes automated exposure control, adjustment of the mA and/or kV according to patient size and/or use of iterative reconstruction technique. CONTRAST:  144m OMNIPAQUE IOHEXOL 300 MG/ML  SOLN COMPARISON:  MRI pelvis 05/12/2019 FINDINGS: Lower chest: Bilateral lower lobe atelectasis versus scarring. Bilateral breast implants noted. Hepatobiliary: No focal liver abnormality. Status post cholecystectomy. Expected trace stranding within the gallbladder fossa surgical bed. Mild intra and extrahepatic biliary ductal dilatation which can be seen in the post cholecystectomy setting. The common bile duct is noted to smoothly taper to normal caliber. Pancreas: No focal lesion. Normal pancreatic contour. No surrounding inflammatory changes. No main pancreatic ductal dilatation. Spleen: Normal in size without focal abnormality. Adrenals/Urinary Tract: No adrenal nodule bilaterally. Bilateral kidneys enhance symmetrically. No hydronephrosis. No hydroureter. The urinary bladder is unremarkable. Stomach/Bowel: Stomach is within normal limits. No evidence of bowel wall thickening or dilatation. Appendix appears normal. Vascular/Lymphatic: The main portal, splenic, superior mesenteric vein are patent. No abdominal aorta or iliac aneurysm. No  abdominal, pelvic, or inguinal lymphadenopathy. Reproductive: Left ovarian corpus luteum cyst. Uterus and bilateral adnexa are unremarkable. Other: No intraperitoneal free fluid. No intraperitoneal free gas. No organized fluid collection. Musculoskeletal: Trace subcutaneus soft tissue edema along the right upper quadrant soft tissues  and abdominal muscles likely postsurgical. No suspicious lytic or blastic osseous lesions. No acute displaced fracture. IMPRESSION: Postsurgical changes with no acute intra-abdominal or intrapelvic abnormality in a patient status post cholecystectomy. Electronically Signed   By: Iven Finn M.D.   On: 05/03/2022 18:59   MR 3D Recon At Scanner  Result Date: 05/03/2022 CLINICAL DATA:  Abdominal pain after cholecystectomy. Jaundice. Postop day 2. EXAM: MRI ABDOMEN WITHOUT AND WITH CONTRAST (INCLUDING MRCP) TECHNIQUE: Multiplanar multisequence MR imaging of the abdomen was performed both before and after the administration of intravenous contrast. Heavily T2-weighted images of the biliary and pancreatic ducts were obtained, and three-dimensional MRCP images were rendered by post processing. CONTRAST:  62m GADAVIST GADOBUTROL 1 MMOL/ML IV SOLN COMPARISON:  CT 05/03/2022.  Ultrasound 04/30/2022. FINDINGS: Portions of exam are mildly motion degraded. Lower chest: Trace right pleural fluid. Bibasilar atelectasis. Bilateral breast implants. Hepatobiliary: Normal noncontrast appearance of the liver. Cholecystectomy. No postoperative fluid collection. Mild intrahepatic biliary duct dilatation for prior cholecystectomy. Example left hepatic duct of 5 mm on 28/5. The common duct measures 9 mm in the porta hepatis and tapers gradually above the ampulla. Within the dependent distal common duct, subtle T2 hypointensity which is suspicious for multiple tiny stones. Only readily apparent on image 22/3 and image 50/13. Pancreas:  Normal, without mass or ductal dilatation. Spleen:  Normal in size, without focal abnormality. Adrenals/Urinary Tract: Normal adrenal glands. Normal kidneys, without hydronephrosis. Stomach/Bowel: Normal stomach and abdominal bowel loops. Vascular/Lymphatic: Normal caliber of the aorta and branch vessels. No retroperitoneal or retrocrural adenopathy. Other: No abdominal ascites. Again identified is  right-sided abdominal wall edema which is likely postoperative. No well-defined fluid collection. Musculoskeletal: No acute osseous abnormality. IMPRESSION: 1. Status post cholecystectomy with mild intrahepatic and borderline extrahepatic common duct dilatation. Suspicion of multiple tiny dependent distal common duct stones. Consider further evaluation with ERCP. 2. No postoperative fluid collection or evidence of pancreatitis. 3. Trace right pleural fluid and bibasilar atelectasis. Electronically Signed   By: KAbigail MiyamotoM.D.   On: 05/03/2022 20:23   DG ERCP  Result Date: 05/04/2022 CLINICAL DATA:  41year old female abdominal pain status post cholecystectomy. EXAM: ERCP TECHNIQUE: Multiple spot images obtained with the fluoroscopic device and submitted for interpretation post-procedure. FLUOROSCOPY TIME:  Fluoroscopy Time:  57 seconds Radiation Exposure Index (if provided by the fluoroscopic device): 15.09 mGy Number of Acquired Spot Images: 3 COMPARISON:  05/03/2022 FINDINGS: Retrograde cannulation of the common bile duct. Cholangiogram demonstrates mild diffuse extrahepatic biliary ductal dilation without definite filling defect. A balloon sweep was performed. IMPRESSION: Mild diffuse extrahepatic biliary ductal dilation. Balloon sweep was performed. These images were submitted for radiologic interpretation only. Please see the procedural report for full procedural details, the amount of contrast, and the fluoroscopy time utilized. DRuthann Cancer MD [facility]ac[facility] LPRadiology Electronically Signed   By: DRuthann CancerM.D.   On: 05/04/2022 10:44   MR ABDOMEN MRCP W WO CONTAST  Result Date: 05/03/2022 CLINICAL DATA:  Abdominal pain after cholecystectomy. Jaundice. Postop day 2. EXAM: MRI ABDOMEN WITHOUT AND WITH CONTRAST (INCLUDING MRCP) TECHNIQUE: Multiplanar multisequence MR imaging of the abdomen was performed both before and after the administration of  intravenous contrast. Heavily T2-weighted images of the  biliary and pancreatic ducts were obtained, and three-dimensional MRCP images were rendered by post processing. CONTRAST:  34m GADAVIST GADOBUTROL 1 MMOL/ML IV SOLN COMPARISON:  CT 05/03/2022.  Ultrasound 04/30/2022. FINDINGS: Portions of exam are mildly motion degraded. Lower chest: Trace right pleural fluid. Bibasilar atelectasis. Bilateral breast implants. Hepatobiliary: Normal noncontrast appearance of the liver. Cholecystectomy. No postoperative fluid collection. Mild intrahepatic biliary duct dilatation for prior cholecystectomy. Example left hepatic duct of 5 mm on 28/5. The common duct measures 9 mm in the porta hepatis and tapers gradually above the ampulla. Within the dependent distal common duct, subtle T2 hypointensity which is suspicious for multiple tiny stones. Only readily apparent on image 22/3 and image 50/13. Pancreas:  Normal, without mass or ductal dilatation. Spleen:  Normal in size, without focal abnormality. Adrenals/Urinary Tract: Normal adrenal glands. Normal kidneys, without hydronephrosis. Stomach/Bowel: Normal stomach and abdominal bowel loops. Vascular/Lymphatic: Normal caliber of the aorta and branch vessels. No retroperitoneal or retrocrural adenopathy. Other: No abdominal ascites. Again identified is right-sided abdominal wall edema which is likely postoperative. No well-defined fluid collection. Musculoskeletal: No acute osseous abnormality. IMPRESSION: 1. Status post cholecystectomy with mild intrahepatic and borderline extrahepatic common duct dilatation. Suspicion of multiple tiny dependent distal common duct stones. Consider further evaluation with ERCP. 2. No postoperative fluid collection or evidence of pancreatitis. 3. Trace right pleural fluid and bibasilar atelectasis. Electronically Signed   By: KAbigail MiyamotoM.D.   On: 05/03/2022 20:23    Anti-infectives: Anti-infectives (From admission, onward)    None         Assessment/Plan POD3 s/p laparoscopic cholecystectomy  Choledocholithiasis S/P ERCP with sphincterotomy today by Dr. MWatt Climes- ERCP successful - CLD and possibly advance to soft later today per GI - LFTs elevated on admission with TBili 4.1 this AM - will recheck this afternoon, if patient tolerating soft diet and wants to go home later tonight I do not think that is unreasonable. If any concerns will keep overnight and recheck LFTs in AM.   FEN: CLD, KVO VTE: LMWH ID: no current abx  LOS: 0 days     KNorm Parcel PTexas [facility] For ChildrenSurgery 05/04/2022, 12:06 PM Please see Amion for pager number during day hours 7:00am-4:30pm

## 2022-05-04 NOTE — Anesthesia Postprocedure Evaluation (Signed)
Anesthesia Post Note  Patient: MARVELL STAVOLA  Procedure(s) Performed: ENDOSCOPIC RETROGRADE CHOLANGIOPANCREATOGRAPHY (ERCP) PANCREATIC STENT PLACEMENT SPHINCTEROTOMY REMOVAL OF STONES     Patient location during evaluation: PACU Anesthesia Type: General Level of consciousness: awake and alert and oriented Pain management: pain level controlled Vital Signs Assessment: post-procedure vital signs reviewed and stable Respiratory status: spontaneous breathing, nonlabored ventilation and respiratory function stable Cardiovascular status: blood pressure returned to baseline and stable Postop Assessment: no apparent nausea or vomiting Anesthetic complications: no   No notable events documented.  Last Vitals:  Vitals:   05/04/22 1030 05/04/22 1040  BP: (!) 111/56 107/64  Pulse: 80 70  Resp: 14 17  Temp: 36.9 C   SpO2: 98% 97%    Last Pain:  Vitals:   05/04/22 1040  TempSrc:   PainSc: 1                  Mady Oubre A.

## 2022-05-04 NOTE — Anesthesia Procedure Notes (Signed)
Procedure Name: Intubation Date/Time: 05/04/2022 9:27 AM Performed by: Montel Clock, CRNA Pre-anesthesia Checklist: Patient identified, Emergency Drugs available, Suction available, Patient being monitored and Timeout performed Patient Re-evaluated:Patient Re-evaluated prior to induction Oxygen Delivery Method: Circle system utilized Preoxygenation: Pre-oxygenation with 100% oxygen Induction Type: IV induction, Rapid sequence and Cricoid Pressure applied Laryngoscope Size: Mac and 3 Grade View: Grade I Tube type: Oral Tube size: 7.0 mm Number of attempts: 1 Airway Equipment and Method: Stylet Placement Confirmation: ETT inserted through vocal cords under direct vision, positive ETCO2 and breath sounds checked- equal and bilateral Secured at: 21 cm Tube secured with: Tape Dental Injury: Teeth and Oropharynx as per pre-operative assessment

## 2022-05-04 NOTE — Consult Note (Signed)
Reason for Consult: CBD stone Referring Physician: Surgical team  Felicia Cole is an 41 y.o. female.  HPI: Patient seen and examined and discussed with the surgical team in her hospital computer chart reviewed and she has not had any previous GI issues until her gallbladder problems and gallbladder problems do not run in the family and she has had a tummy tuck but no other abdominal surgeries but her cholecystectomy and she has not felt good since her procedure on Friday and used to work in our Endo unit at the hospital and she has no other complaints  Past Medical History:  Diagnosis Date   Allergy    Anxiety    Asthma    Herpesvirus 2    Vaginal delivery 2013, 2016X2   Vitamin D deficiency     Past Surgical History:  Procedure Laterality Date   BREAST SURGERY  04/13/2017   augmentation    CHOLECYSTECTOMY N/A 05/01/2022   Procedure: LAPAROSCOPIC CHOLECYSTECTOMY;  Surgeon: Mickeal Skinner, MD;  Location: [place];  Service: General;  Laterality: N/A;   INTRAOPERATIVE CHOLANGIOGRAM N/A 05/01/2022   Procedure: ATTEMPTED INTRAOPERATIVE CHOLANGIOGRAM;  Surgeon: Kieth Brightly Arta Bruce, MD;  Location: [facility];  Service: General;  Laterality: N/A;   LAPAROSCOPIC TUBAL LIGATION Bilateral 11/15/2015   Procedure: LAPAROSCOPIC TUBAL LIGATION;  Surgeon: Brien Few, MD;  Location: [facility];  Service: Gynecology;  Laterality: Bilateral;   tummy tuck  04/16/2017    Family History  Problem Relation Age of Onset   Hypertension Mother    Anxiety disorder Mother    Thyroid disease Mother    Epilepsy Father    ALS Father    Cancer Maternal Aunt        breast   Heart disease Maternal Grandmother    Cancer Maternal Grandmother        breast   Heart disease Maternal Grandfather    Cancer Paternal Grandmother        ovarian   Heart disease Paternal Grandmother    Heart disease Paternal Grandfather    Hypertension Paternal Grandfather     Social History:  reports that she has never smoked.  She has never used smokeless tobacco. She reports that she does not drink alcohol and does not use drugs.  Allergies:  Allergies  Allergen Reactions   Bee Venom Anaphylaxis   Shellfish Allergy Anaphylaxis    Medications: I have reviewed the patient's current medications.  Results for orders placed or performed during the hospital encounter of 05/03/22 (from the past 48 hour(s))  Comprehensive metabolic panel     Status: Abnormal   Collection Time: 05/03/22  5:50 PM  Result Value Ref Range   Sodium 139 135 - 145 mmol/L   Potassium 3.5 3.5 - 5.1 mmol/L   Chloride 107 98 - 111 mmol/L   CO2 25 22 - 32 mmol/L   Glucose, Bld 164 (H) 70 - 99 mg/dL    Comment: Glucose reference range applies only to samples taken after fasting for at least 8 hours.   BUN 8 6 - 20 mg/dL   Creatinine, Ser 0.66 0.44 - 1.00 mg/dL   Calcium 9.0 8.9 - 10.3 mg/dL   Total Protein 7.1 6.5 - 8.1 g/dL   Albumin 3.8 3.5 - 5.0 g/dL   AST 165 (H) 15 - 41 U/L   ALT 512 (H) 0 - 44 U/L   Alkaline Phosphatase 191 (H) 38 - 126 U/L   Total Bilirubin 4.2 (H) 0.3 - 1.2 mg/dL  GFR, Estimated >60 >60 mL/min    Comment: (NOTE) Calculated using the CKD-EPI Creatinine Equation (2021)    Anion gap 7 5 - 15    Comment: Performed at [facility], [place] [address], [place], [place] [address]  Lipase, blood     Status: Abnormal   Collection Time: 05/03/22  5:50 PM  Result Value Ref Range   Lipase 68 (H) 11 - 51 U/L    Comment: Performed at [facility], [place] [address], [place], [place] [address]  CBC with Differential     Status: None   Collection Time: 05/03/22  5:50 PM  Result Value Ref Range   WBC 7.5 4.0 - 10.5 K/uL   RBC 4.05 3.87 - 5.11 MIL/uL   Hemoglobin 12.5 12.0 - 15.0 g/dL   HCT 38.1 36.0 - 46.0 %   MCV 94.1 80.0 - 100.0 fL   MCH 30.9 26.0 - 34.0 pg   MCHC 32.8 30.0 - 36.0 g/dL   RDW 13.2 11.5 - 15.5 %   Platelets 224 150 - 400 K/uL   nRBC 0.0 0.0 - 0.2 %   Neutrophils  Relative % 78 %   Neutro Abs 5.8 1.7 - 7.7 K/uL   Lymphocytes Relative 12 %   Lymphs Abs 0.9 0.7 - 4.0 K/uL   Monocytes Relative 9 %   Monocytes Absolute 0.7 0.1 - 1.0 K/uL   Eosinophils Relative 1 %   Eosinophils Absolute 0.1 0.0 - 0.5 K/uL   Basophils Relative 0 %   Basophils Absolute 0.0 0.0 - 0.1 K/uL   Immature Granulocytes 0 %   Abs Immature Granulocytes 0.02 0.00 - 0.07 K/uL    Comment: Performed at [facility], [place] [address], [place], [place] [address]  I-Stat beta hCG blood, ED     Status: None   Collection Time: 05/03/22  6:03 PM  Result Value Ref Range   I-stat hCG, quantitative <5.0 <5 mIU/mL   Comment 3            Comment:   GEST. AGE      CONC.  (mIU/mL)   <=1 WEEK        5 - 50     2 WEEKS       50 - 500     3 WEEKS       100 - 10,000     4 WEEKS     1,000 - 30,000        FEMALE AND NON-PREGNANT FEMALE:     LESS THAN 5 mIU/mL   Comprehensive metabolic panel     Status: Abnormal   Collection Time: 05/04/22  3:35 AM  Result Value Ref Range   Sodium 137 135 - 145 mmol/L   Potassium 3.5 3.5 - 5.1 mmol/L   Chloride 107 98 - 111 mmol/L   CO2 24 22 - 32 mmol/L   Glucose, Bld 105 (H) 70 - 99 mg/dL    Comment: Glucose reference range applies only to samples taken after fasting for at least 8 hours.   BUN 6 6 - 20 mg/dL   Creatinine, Ser 0.52 0.44 - 1.00 mg/dL   Calcium 8.8 (L) 8.9 - 10.3 mg/dL   Total Protein 6.8 6.5 - 8.1 g/dL   Albumin 3.6 3.5 - 5.0 g/dL   AST 125 (H) 15 - 41 U/L   ALT 416 (H) 0 - 44 U/L   Alkaline Phosphatase 170 (H) 38 - 126 U/L   Total  Bilirubin 4.1 (H) 0.3 - 1.2 mg/dL   GFR, Estimated >60 >60 mL/min    Comment: (NOTE) Calculated using the CKD-EPI Creatinine Equation (2021)    Anion gap 6 5 - 15    Comment: Performed at [facility], [place] [address], [place], [place] [address]  Glucose, capillary     Status: Abnormal   Collection Time: 05/04/22  4:13 AM  Result Value Ref Range   Glucose-Capillary  106 (H) 70 - 99 mg/dL    Comment: Glucose reference range applies only to samples taken after fasting for at least 8 hours.    CT ABDOMEN PELVIS W CONTRAST  Result Date: 05/03/2022 CLINICAL DATA:  Abdominal pain, post-op EXAM: CT ABDOMEN AND PELVIS WITH CONTRAST TECHNIQUE: Multidetector CT imaging of the abdomen and pelvis was performed using the standard protocol following bolus administration of intravenous contrast. RADIATION DOSE REDUCTION: This exam was performed according to the departmental dose-optimization program which includes automated exposure control, adjustment of the mA and/or kV according to patient size and/or use of iterative reconstruction technique. CONTRAST:  161m OMNIPAQUE IOHEXOL 300 MG/ML  SOLN COMPARISON:  MRI pelvis 05/12/2019 FINDINGS: Lower chest: Bilateral lower lobe atelectasis versus scarring. Bilateral breast implants noted. Hepatobiliary: No focal liver abnormality. Status post cholecystectomy. Expected trace stranding within the gallbladder fossa surgical bed. Mild intra and extrahepatic biliary ductal dilatation which can be seen in the post cholecystectomy setting. The common bile duct is noted to smoothly taper to normal caliber. Pancreas: No focal lesion. Normal pancreatic contour. No surrounding inflammatory changes. No main pancreatic ductal dilatation. Spleen: Normal in size without focal abnormality. Adrenals/Urinary Tract: No adrenal nodule bilaterally. Bilateral kidneys enhance symmetrically. No hydronephrosis. No hydroureter. The urinary bladder is unremarkable. Stomach/Bowel: Stomach is within normal limits. No evidence of bowel wall thickening or dilatation. Appendix appears normal. Vascular/Lymphatic: The main portal, splenic, superior mesenteric vein are patent. No abdominal aorta or iliac aneurysm. No abdominal, pelvic, or inguinal lymphadenopathy. Reproductive: Left ovarian corpus luteum cyst. Uterus and bilateral adnexa are unremarkable. Other: No  intraperitoneal free fluid. No intraperitoneal free gas. No organized fluid collection. Musculoskeletal: Trace subcutaneus soft tissue edema along the right upper quadrant soft tissues and abdominal muscles likely postsurgical. No suspicious lytic or blastic osseous lesions. No acute displaced fracture. IMPRESSION: Postsurgical changes with no acute intra-abdominal or intrapelvic abnormality in a patient status post cholecystectomy. Electronically Signed   By: MIven FinnM.D.   On: 05/03/2022 18:59   MR 3D Recon At Scanner  Result Date: 05/03/2022 CLINICAL DATA:  Abdominal pain after cholecystectomy. Jaundice. Postop day 2. EXAM: MRI ABDOMEN WITHOUT AND WITH CONTRAST (INCLUDING MRCP) TECHNIQUE: Multiplanar multisequence MR imaging of the abdomen was performed both before and after the administration of intravenous contrast. Heavily T2-weighted images of the biliary and pancreatic ducts were obtained, and three-dimensional MRCP images were rendered by post processing. CONTRAST:  690mGADAVIST GADOBUTROL 1 MMOL/ML IV SOLN COMPARISON:  CT 05/03/2022.  Ultrasound 04/30/2022. FINDINGS: Portions of exam are mildly motion degraded. Lower chest: Trace right pleural fluid. Bibasilar atelectasis. Bilateral breast implants. Hepatobiliary: Normal noncontrast appearance of the liver. Cholecystectomy. No postoperative fluid collection. Mild intrahepatic biliary duct dilatation for prior cholecystectomy. Example left hepatic duct of 5 mm on 28/5. The common duct measures 9 mm in the porta hepatis and tapers gradually above the ampulla. Within the dependent distal common duct, subtle T2 hypointensity which is suspicious for multiple tiny stones. Only readily apparent on image 22/3 and image 50/13. Pancreas:  Normal, without  mass or ductal dilatation. Spleen:  Normal in size, without focal abnormality. Adrenals/Urinary Tract: Normal adrenal glands. Normal kidneys, without hydronephrosis. Stomach/Bowel: Normal stomach and  abdominal bowel loops. Vascular/Lymphatic: Normal caliber of the aorta and branch vessels. No retroperitoneal or retrocrural adenopathy. Other: No abdominal ascites. Again identified is right-sided abdominal wall edema which is likely postoperative. No well-defined fluid collection. Musculoskeletal: No acute osseous abnormality. IMPRESSION: 1. Status post cholecystectomy with mild intrahepatic and borderline extrahepatic common duct dilatation. Suspicion of multiple tiny dependent distal common duct stones. Consider further evaluation with ERCP. 2. No postoperative fluid collection or evidence of pancreatitis. 3. Trace right pleural fluid and bibasilar atelectasis. Electronically Signed   By: Abigail Miyamoto M.D.   On: 05/03/2022 20:23   MR ABDOMEN MRCP W WO CONTAST  Result Date: 05/03/2022 CLINICAL DATA:  Abdominal pain after cholecystectomy. Jaundice. Postop day 2. EXAM: MRI ABDOMEN WITHOUT AND WITH CONTRAST (INCLUDING MRCP) TECHNIQUE: Multiplanar multisequence MR imaging of the abdomen was performed both before and after the administration of intravenous contrast. Heavily T2-weighted images of the biliary and pancreatic ducts were obtained, and three-dimensional MRCP images were rendered by post processing. CONTRAST:  44m GADAVIST GADOBUTROL 1 MMOL/ML IV SOLN COMPARISON:  CT 05/03/2022.  Ultrasound 04/30/2022. FINDINGS: Portions of exam are mildly motion degraded. Lower chest: Trace right pleural fluid. Bibasilar atelectasis. Bilateral breast implants. Hepatobiliary: Normal noncontrast appearance of the liver. Cholecystectomy. No postoperative fluid collection. Mild intrahepatic biliary duct dilatation for prior cholecystectomy. Example left hepatic duct of 5 mm on 28/5. The common duct measures 9 mm in the porta hepatis and tapers gradually above the ampulla. Within the dependent distal common duct, subtle T2 hypointensity which is suspicious for multiple tiny stones. Only readily apparent on image 22/3 and  image 50/13. Pancreas:  Normal, without mass or ductal dilatation. Spleen:  Normal in size, without focal abnormality. Adrenals/Urinary Tract: Normal adrenal glands. Normal kidneys, without hydronephrosis. Stomach/Bowel: Normal stomach and abdominal bowel loops. Vascular/Lymphatic: Normal caliber of the aorta and branch vessels. No retroperitoneal or retrocrural adenopathy. Other: No abdominal ascites. Again identified is right-sided abdominal wall edema which is likely postoperative. No well-defined fluid collection. Musculoskeletal: No acute osseous abnormality. IMPRESSION: 1. Status post cholecystectomy with mild intrahepatic and borderline extrahepatic common duct dilatation. Suspicion of multiple tiny dependent distal common duct stones. Consider further evaluation with ERCP. 2. No postoperative fluid collection or evidence of pancreatitis. 3. Trace right pleural fluid and bibasilar atelectasis. Electronically Signed   By: KAbigail MiyamotoM.D.   On: 05/03/2022 20:23    Review of Systems negative except above Blood pressure 104/67, pulse 79, temperature 98.2 F (36.8 C), temperature source Temporal, resp. rate 18, height '5\' 1"'$  (1.549 m), weight 63.5 kg, SpO2 98 %. Physical Exam exam please see preassessment evaluation labs and x-rays reviewed LFTs increased lipase minimally elevated CBC okay CT and MRI reviewed and preop ultrasound reviewed and Intra-Op cholangiogram reviewed  Assessment/Plan: Probable CBD stones Plan: The risk benefits methods and success rate of ERCP was discussed with the patient and will proceed this morning with further work-up and plans pending those findings and as an aside if doing well postprocedure and able to eat may be able to go home at 5 or 6 PM which is her wishes if the procedure goes well  Harrold Fitchett E 05/04/2022, 9:03 AM

## 2022-05-04 NOTE — Op Note (Signed)
[facility] Patient Name: Felicia Cole Procedure Date: 05/04/2022 MRN: [id number] Attending MD: Clarene Essex , MD Date of Birth: Dec 01, 1980 CSN: [id number] Age: 41 Admit Type: Outpatient Procedure:                ERCP Indications:              For therapy of bile duct stone(s) on MRCP as well                            as continued pain and elevated liver tests Providers:                Clarene Essex, MD, Dulcy Fanny, Despina Pole,                            Technician, Marla Roe, CRNA Referring MD:              Medicines:                General Anesthesia Complications:            No immediate complications. Estimated Blood Loss:     Estimated blood loss: none. Procedure:                Pre-Anesthesia Assessment:                           - Prior to the procedure, a History and Physical                            was performed, and patient medications and                            allergies were reviewed. The patient's tolerance of                            previous anesthesia was also reviewed. The risks                            and benefits of the procedure and the sedation                            options and risks were discussed with the patient.                            All questions were answered, and informed consent                            was obtained. Prior Anticoagulants: The patient has                            taken no previous anticoagulant or antiplatelet                            agents. ASA Grade Assessment: II - A patient with  mild systemic disease. After reviewing the risks                            and benefits, the patient was deemed in                            satisfactory condition to undergo the procedure.                           After obtaining informed consent, the scope was                            passed under direct vision. Throughout the                            procedure, the  patient's blood pressure, pulse, and                            oxygen saturations were monitored continuously. The                            [id number]-[id number] ([id number]) Olympus duodenoscope was                            introduced through the mouth, and used to inject                            contrast into and used to locate the major papilla.                            The ERCP was somewhat difficult due to challenging                            cannulation. Successful completion of the procedure                            was aided by performing the maneuvers documented                            (below) in this report. The patient tolerated the                            procedure well. Scope In: Scope Out: Findings:      The major papilla was normal although possibly a minimally edematous.       Unfortunately on the first few cannulation attempts the wire went       towards the pancreas so we elected to place one 4 Fr by 3 cm pancreatic       stent with a 3/4 external pigtail and no internal flaps was placed 2 cm       into the ventral pancreatic duct. The stent was in good position. Deep       selective cannulation of the CBD was then readily obtained cannulating       next to the pancreatic stent in  the customary fashion and dye was       injected to confirm position and we proceeded with a biliary       sphincterotomy was made with a Hydratome sphincterotome using ERBE       electrocautery. There was no post-sphincterotomy bleeding. We proceeded       until we had adequate biliary drainage and could get the fully bowed       sphincterotome easily in and out of the duct and choledocholithiasis was       found in a questionably minimally dilated duct. The biliary tree was       swept with an adjustable 9- 12 mm balloon starting at the bifurcation.       Sludge was swept from the duct. All stones were removed. We initially       used the 9 mm balloon which passed readily through the  patent       sphincterotomy site and a few tiny stone fragments and minimal sludge       was removed and we then proceeded with multiple subsequent 12 mm       balloons and nothing was found nor was anything further delivered. We       then proceeded with an occlusion cholangiogram in the customary fashion       which was normal and there was adequate biliary drainage and the wire       and the balloon were removed and the patient tolerated the procedure well Impression:               - The major papilla appeared normal except for                            maybe minimal edema.                           - Choledocholithiasis was found. Complete removal                            was accomplished by biliary sphincterotomy and                            balloon extraction.                           - One pancreatic stent was placed into the ventral                            pancreatic duct.                           - A biliary sphincterotomy was performed.                           - The biliary tree was swept and nothing was found                            at the end of the procedure. Moderate Sedation:      Not Applicable - Patient had care per Anesthesia. Recommendation:           - Clear  liquid diet for 6 hours. If doing well                            later this afternoon may have soft solids and if                            does well after that consider discharge today                           - Continue present medications.                           - Return to GI clinic PRN.                           - Telephone GI clinic if symptomatic PRN.                           - Check liver enzymes (AST, ALT, alkaline                            phosphatase, bilirubin) tomorrow if still in the                            hospital if not we will repeat liver enzymes later                            in the week and follow back to normal as an                            outpatient.                            - Perform a flat plate and upright abdominal x-ray                            in 2 weeks to confirm pancreatic duct stent passing                            on its own. Procedure Code(s):        --- Professional ---                           [phone], Esophagogastroduodenoscopy, flexible,                            transoral; diagnostic, including collection of                            specimen(s) by brushing or washing, when performed                            (separate procedure) Diagnosis Code(s):        --- Professional ---  K80.50, Calculus of bile duct without cholangitis                            or cholecystitis without obstruction CPT copyright 2019 [employer]. All rights reserved. The codes documented in this report are preliminary and upon coder review may  be revised to meet current compliance requirements. Clarene Essex, MD 05/04/2022 10:21:16 AM This report has been signed electronically. Number of Addenda: 0

## 2022-05-04 NOTE — Progress Notes (Signed)
Transition of Care [facility]) Screening Note  Patient Details  Name: Felicia Cole Date of Birth: 1981-06-21  Transition of Care [facility]) CM/SW Contact:    Sherie Don, LCSW Phone Number: 05/04/2022, 9:40 AM  Transition of Care Department [facility]) has reviewed patient and no TOC needs have been identified at this time. We will continue to monitor patient advancement through interdisciplinary progression rounds. If new patient transition needs arise, please place a TOC consult.

## 2022-05-05 ENCOUNTER — Encounter (HOSPITAL_COMMUNITY): Payer: Self-pay | Admitting: Gastroenterology

## 2022-05-05 DIAGNOSIS — R1011 Right upper quadrant pain: Secondary | ICD-10-CM | POA: Diagnosis present

## 2022-05-05 DIAGNOSIS — Z8041 Family history of malignant neoplasm of ovary: Secondary | ICD-10-CM | POA: Diagnosis not present

## 2022-05-05 DIAGNOSIS — R0902 Hypoxemia: Secondary | ICD-10-CM | POA: Diagnosis not present

## 2022-05-05 DIAGNOSIS — Y838 Other surgical procedures as the cause of abnormal reaction of the patient, or of later complication, without mention of misadventure at the time of the procedure: Secondary | ICD-10-CM | POA: Diagnosis not present

## 2022-05-05 DIAGNOSIS — Z803 Family history of malignant neoplasm of breast: Secondary | ICD-10-CM | POA: Diagnosis not present

## 2022-05-05 DIAGNOSIS — Z818 Family history of other mental and behavioral disorders: Secondary | ICD-10-CM | POA: Diagnosis not present

## 2022-05-05 DIAGNOSIS — K9189 Other postprocedural complications and disorders of digestive system: Secondary | ICD-10-CM | POA: Diagnosis not present

## 2022-05-05 DIAGNOSIS — Z79899 Other long term (current) drug therapy: Secondary | ICD-10-CM | POA: Diagnosis not present

## 2022-05-05 DIAGNOSIS — K9186 Retained cholelithiasis following cholecystectomy: Secondary | ICD-10-CM | POA: Diagnosis present

## 2022-05-05 DIAGNOSIS — K859 Acute pancreatitis without necrosis or infection, unspecified: Secondary | ICD-10-CM | POA: Diagnosis not present

## 2022-05-05 DIAGNOSIS — K567 Ileus, unspecified: Secondary | ICD-10-CM | POA: Diagnosis not present

## 2022-05-05 DIAGNOSIS — R319 Hematuria, unspecified: Secondary | ICD-10-CM | POA: Diagnosis not present

## 2022-05-05 DIAGNOSIS — Z91030 Bee allergy status: Secondary | ICD-10-CM | POA: Diagnosis not present

## 2022-05-05 DIAGNOSIS — Z8349 Family history of other endocrine, nutritional and metabolic diseases: Secondary | ICD-10-CM | POA: Diagnosis not present

## 2022-05-05 DIAGNOSIS — Z8249 Family history of ischemic heart disease and other diseases of the circulatory system: Secondary | ICD-10-CM | POA: Diagnosis not present

## 2022-05-05 DIAGNOSIS — Z820 Family history of epilepsy and other diseases of the nervous system: Secondary | ICD-10-CM | POA: Diagnosis not present

## 2022-05-05 DIAGNOSIS — Z91013 Allergy to seafood: Secondary | ICD-10-CM | POA: Diagnosis not present

## 2022-05-05 DIAGNOSIS — J9811 Atelectasis: Secondary | ICD-10-CM | POA: Diagnosis not present

## 2022-05-05 LAB — CBC WITH DIFFERENTIAL/PLATELET
Abs Immature Granulocytes: 0.05 10*3/uL (ref 0.00–0.07)
Basophils Absolute: 0 10*3/uL (ref 0.0–0.1)
Basophils Relative: 0 %
Eosinophils Absolute: 0.1 10*3/uL (ref 0.0–0.5)
Eosinophils Relative: 1 %
HCT: 37.8 % (ref 36.0–46.0)
Hemoglobin: 12.5 g/dL (ref 12.0–15.0)
Immature Granulocytes: 1 %
Lymphocytes Relative: 19 %
Lymphs Abs: 2 10*3/uL (ref 0.7–4.0)
MCH: 30.9 pg (ref 26.0–34.0)
MCHC: 33.1 g/dL (ref 30.0–36.0)
MCV: 93.3 fL (ref 80.0–100.0)
Monocytes Absolute: 0.9 10*3/uL (ref 0.1–1.0)
Monocytes Relative: 9 %
Neutro Abs: 7.3 10*3/uL (ref 1.7–7.7)
Neutrophils Relative %: 70 %
Platelets: 255 10*3/uL (ref 150–400)
RBC: 4.05 MIL/uL (ref 3.87–5.11)
RDW: 12.9 % (ref 11.5–15.5)
WBC: 10.3 10*3/uL (ref 4.0–10.5)
nRBC: 0 % (ref 0.0–0.2)

## 2022-05-05 LAB — URINALYSIS, ROUTINE W REFLEX MICROSCOPIC
Bilirubin Urine: NEGATIVE
Glucose, UA: NEGATIVE mg/dL
Hgb urine dipstick: NEGATIVE
Ketones, ur: NEGATIVE mg/dL
Nitrite: NEGATIVE
Protein, ur: 30 mg/dL — AB
Specific Gravity, Urine: 1.021 (ref 1.005–1.030)
pH: 5 (ref 5.0–8.0)

## 2022-05-05 LAB — COMPREHENSIVE METABOLIC PANEL
ALT: 374 U/L — ABNORMAL HIGH (ref 0–44)
AST: 128 U/L — ABNORMAL HIGH (ref 15–41)
Albumin: 3.7 g/dL (ref 3.5–5.0)
Alkaline Phosphatase: 244 U/L — ABNORMAL HIGH (ref 38–126)
Anion gap: 8 (ref 5–15)
BUN: 6 mg/dL (ref 6–20)
CO2: 23 mmol/L (ref 22–32)
Calcium: 8.7 mg/dL — ABNORMAL LOW (ref 8.9–10.3)
Chloride: 105 mmol/L (ref 98–111)
Creatinine, Ser: 0.71 mg/dL (ref 0.44–1.00)
GFR, Estimated: >60 mL/min (ref >60)
Glucose, Bld: 133 mg/dL — ABNORMAL HIGH (ref 70–99)
Potassium: 3.4 mmol/L — ABNORMAL LOW (ref 3.5–5.1)
Sodium: 136 mmol/L (ref 135–145)
Total Bilirubin: 2.6 mg/dL — ABNORMAL HIGH (ref 0.3–1.2)
Total Protein: 7.1 g/dL (ref 6.5–8.1)

## 2022-05-05 LAB — LIPASE, BLOOD: Lipase: 957 U/L — ABNORMAL HIGH (ref 11–51)

## 2022-05-05 MED ORDER — LACTATED RINGERS IV BOLUS: 1000.0000 mL | Freq: Three times a day (TID) | INTRAVENOUS | Status: DC | PRN

## 2022-05-05 MED ORDER — PHENOL 1.4 % MT LIQD: 2.0000 | OROMUCOSAL | Status: DC | PRN

## 2022-05-05 MED ORDER — CALCIUM POLYCARBOPHIL 625 MG PO TABS
625.0000 mg | ORAL_TABLET | Freq: Two times a day (BID) | ORAL | Status: DC
  Administered 2022-05-05 – 2022-05-10 (×10): 625 mg via ORAL
  Filled 2022-05-05 (×11): qty 1

## 2022-05-05 MED ORDER — METOPROLOL TARTRATE 5 MG/5ML IV SOLN
5.0000 mg | Freq: Four times a day (QID) | INTRAVENOUS | Status: DC | PRN
  Administered 2022-05-07: 5 mg via INTRAVENOUS
  Filled 2022-05-05: qty 5

## 2022-05-05 MED ORDER — PROCHLORPERAZINE EDISYLATE 10 MG/2ML IJ SOLN: 5.0000 mg | INTRAMUSCULAR | Status: DC | PRN

## 2022-05-05 MED ORDER — BISACODYL 10 MG RE SUPP
10.0000 mg | Freq: Two times a day (BID) | RECTAL | Status: DC | PRN
  Administered 2022-05-06 – 2022-05-07 (×3): 10 mg via RECTAL
  Filled 2022-05-05 (×3): qty 1

## 2022-05-05 MED ORDER — METHOCARBAMOL 500 MG PO TABS
1000.0000 mg | ORAL_TABLET | Freq: Four times a day (QID) | ORAL | Status: DC | PRN
  Administered 2022-05-05 – 2022-05-10 (×11): 1000 mg via ORAL
  Filled 2022-05-05 (×11): qty 2

## 2022-05-05 MED ORDER — ALUM & MAG HYDROXIDE-SIMETH 200-200-20 MG/5ML PO SUSP: 30.0000 mL | Freq: Four times a day (QID) | ORAL | Status: DC | PRN

## 2022-05-05 MED ORDER — ACETAMINOPHEN 650 MG RE SUPP: 650.0000 mg | Freq: Four times a day (QID) | RECTAL | Status: DC | PRN

## 2022-05-05 MED ORDER — SODIUM CHLORIDE 0.9 % IV SOLN: 8.0000 mg | Freq: Four times a day (QID) | INTRAVENOUS | Status: DC | PRN

## 2022-05-05 MED ORDER — ACETAMINOPHEN 325 MG PO TABS
325.0000 mg | ORAL_TABLET | Freq: Four times a day (QID) | ORAL | Status: DC | PRN
  Administered 2022-05-05 – 2022-05-10 (×4): 650 mg via ORAL
  Filled 2022-05-05 (×4): qty 2

## 2022-05-05 MED ORDER — ALBUTEROL SULFATE (2.5 MG/3ML) 0.083% IN NEBU: 2.5000 mg | INHALATION_SOLUTION | Freq: Four times a day (QID) | RESPIRATORY_TRACT | Status: DC | PRN

## 2022-05-05 MED ORDER — METHOCARBAMOL 1000 MG/10ML IJ SOLN
1000.0000 mg | Freq: Four times a day (QID) | INTRAVENOUS | Status: DC | PRN
  Administered 2022-05-05: 1000 mg via INTRAVENOUS
  Filled 2022-05-05: qty 1000

## 2022-05-05 MED ORDER — BISMUTH SUBSALICYLATE 262 MG/15ML PO SUSP: 30.0000 mL | Freq: Three times a day (TID) | ORAL | Status: DC | PRN

## 2022-05-05 MED ORDER — ONDANSETRON HCL 4 MG/2ML IJ SOLN: 4.0000 mg | Freq: Four times a day (QID) | INTRAMUSCULAR | Status: DC | PRN

## 2022-05-05 MED ORDER — SIMETHICONE 40 MG/0.6ML PO SUSP
80.0000 mg | Freq: Four times a day (QID) | ORAL | Status: DC | PRN
  Administered 2022-05-08 – 2022-05-09 (×3): 80 mg via ORAL
  Filled 2022-05-05 (×4): qty 1.2

## 2022-05-05 MED ORDER — ENALAPRILAT 1.25 MG/ML IV SOLN: 0.6250 mg | Freq: Four times a day (QID) | INTRAVENOUS | Status: DC | PRN

## 2022-05-05 MED ORDER — METOPROLOL TARTRATE 5 MG/5ML IV SOLN
5.0000 mg | Freq: Once | INTRAVENOUS | Status: AC
  Administered 2022-05-05: 5 mg via INTRAVENOUS
  Filled 2022-05-05: qty 5

## 2022-05-05 MED ORDER — HYDROMORPHONE HCL 1 MG/ML IJ SOLN
1.0000 mg | INTRAMUSCULAR | Status: DC | PRN
  Administered 2022-05-05 – 2022-05-07 (×16): 2 mg via INTRAVENOUS
  Filled 2022-05-05 (×16): qty 2

## 2022-05-05 MED ORDER — MENTHOL 3 MG MT LOZG: 1.0000 | LOZENGE | OROMUCOSAL | Status: DC | PRN

## 2022-05-05 MED ORDER — MAGIC MOUTHWASH: 15.0000 mL | Freq: Four times a day (QID) | ORAL | Status: DC | PRN

## 2022-05-05 MED ORDER — LIP MEDEX EX OINT
1.0000 "application " | TOPICAL_OINTMENT | Freq: Two times a day (BID) | CUTANEOUS | Status: DC
  Administered 2022-05-05 – 2022-05-10 (×10): 1 via TOPICAL
  Filled 2022-05-05: qty 7

## 2022-05-05 MED ORDER — SALINE SPRAY 0.65 % NA SOLN: 1.0000 | Freq: Four times a day (QID) | NASAL | Status: DC | PRN

## 2022-05-05 MED ORDER — ALUM & MAG HYDROXIDE-SIMETH 200-200-20 MG/5ML PO SUSP
30.0000 mL | ORAL | Status: DC | PRN
Start: 2022-05-05 — End: 2022-05-05
  Filled 2022-05-05: qty 30

## 2022-05-05 MED ORDER — NAPHAZOLINE-GLYCERIN 0.012-0.25 % OP SOLN: 1.0000 [drp] | Freq: Four times a day (QID) | OPHTHALMIC | Status: DC | PRN

## 2022-05-05 NOTE — Progress Notes (Signed)
rm [place], had ERCP yesterday, Now c/o abdominal spasm and pain, Would like muscle relaxer and tylenol for pain.

## 2022-05-05 NOTE — Progress Notes (Signed)
Progress Note  1 Day Post-Op  Subjective: Pt reports severe abdominal pain after 0100 this AM. Pain is epigastric, has not gotten any relief with oxycodone. No PIV currently. Discussed with RN that patient needs IV placed and start aggressive IV hydration and can use IV pain medications to try to get pain better controlled. Husband at bedside this AM.   Objective: Vital signs in last 24 hours: Temp:  [97.3 F (36.3 C)-99.5 F (37.5 C)] 98.4 F (36.9 C) (06/06 0527) Pulse Rate:  [66-85] 79 (06/06 0527) Resp:  [13-17] 17 (06/06 0527) BP: (102-115)/(56-75) 108/69 (06/06 0527) SpO2:  [93 %-99 %] 97 % (06/06 0527) Last BM Date : 05/03/22  Intake/Output from previous day: 06/05 0701 - 06/06 0700 In: 750 [P.O.:250; I.V.:500] Out: 2 [Urine:2] Intake/Output this shift: No intake/output data recorded.  PE: General: pleasant, WD, WN female who is laying in bed and appears uncomfortable HEENT: Sclera are mildly icteric Lungs: Respiratory effort nonlabored Abd: soft, ND Skin: jaundiced Psych: A&Ox3 with an appropriate affect.    Lab Results:  Recent Labs    05/03/22 1750 05/05/22 0345  WBC 7.5 10.3  HGB 12.5 12.5  HCT 38.1 37.8  PLT 224 255    BMET Recent Labs    05/04/22 0335 05/05/22 0345  NA 137 136  K 3.5 3.4*  CL 107 105  CO2 24 23  GLUCOSE 105* 133*  BUN 6 6  CREATININE 0.52 0.71  CALCIUM 8.8* 8.7*    PT/INR No results for input(s): LABPROT, INR in the last 72 hours. CMP     Component Value Date/Time   NA 136 05/05/2022 0345   K 3.4 (L) 05/05/2022 0345   CL 105 05/05/2022 0345   CO2 23 05/05/2022 0345   GLUCOSE 133 (H) 05/05/2022 0345   BUN 6 05/05/2022 0345   CREATININE 0.71 05/05/2022 0345   CREATININE 0.83 05/11/2019 0846   CALCIUM 8.7 (L) 05/05/2022 0345   PROT 7.1 05/05/2022 0345   ALBUMIN 3.7 05/05/2022 0345   AST 128 (H) 05/05/2022 0345   ALT 374 (H) 05/05/2022 0345   ALKPHOS 244 (H) 05/05/2022 0345   BILITOT 2.6 (H) 05/05/2022 0345    GFRNONAA >60 05/05/2022 0345   GFRNONAA 90 05/11/2019 0846   GFRAA 104 05/11/2019 0846   Lipase     Component Value Date/Time   LIPASE 957 (H) 05/05/2022 0345       Studies/Results: CT ABDOMEN PELVIS W CONTRAST  Result Date: 05/03/2022 CLINICAL DATA:  Abdominal pain, post-op EXAM: CT ABDOMEN AND PELVIS WITH CONTRAST TECHNIQUE: Multidetector CT imaging of the abdomen and pelvis was performed using the standard protocol following bolus administration of intravenous contrast. RADIATION DOSE REDUCTION: This exam was performed according to the departmental dose-optimization program which includes automated exposure control, adjustment of the mA and/or kV according to patient size and/or use of iterative reconstruction technique. CONTRAST:  172m OMNIPAQUE IOHEXOL 300 MG/ML  SOLN COMPARISON:  MRI pelvis 05/12/2019 FINDINGS: Lower chest: Bilateral lower lobe atelectasis versus scarring. Bilateral breast implants noted. Hepatobiliary: No focal liver abnormality. Status post cholecystectomy. Expected trace stranding within the gallbladder fossa surgical bed. Mild intra and extrahepatic biliary ductal dilatation which can be seen in the post cholecystectomy setting. The common bile duct is noted to smoothly taper to normal caliber. Pancreas: No focal lesion. Normal pancreatic contour. No surrounding inflammatory changes. No main pancreatic ductal dilatation. Spleen: Normal in size without focal abnormality. Adrenals/Urinary Tract: No adrenal nodule bilaterally. Bilateral kidneys enhance symmetrically. No hydronephrosis.  No hydroureter. The urinary bladder is unremarkable. Stomach/Bowel: Stomach is within normal limits. No evidence of bowel wall thickening or dilatation. Appendix appears normal. Vascular/Lymphatic: The main portal, splenic, superior mesenteric vein are patent. No abdominal aorta or iliac aneurysm. No abdominal, pelvic, or inguinal lymphadenopathy. Reproductive: Left ovarian corpus luteum  cyst. Uterus and bilateral adnexa are unremarkable. Other: No intraperitoneal free fluid. No intraperitoneal free gas. No organized fluid collection. Musculoskeletal: Trace subcutaneus soft tissue edema along the right upper quadrant soft tissues and abdominal muscles likely postsurgical. No suspicious lytic or blastic osseous lesions. No acute displaced fracture. IMPRESSION: Postsurgical changes with no acute intra-abdominal or intrapelvic abnormality in a patient status post cholecystectomy. Electronically Signed   By: Iven Finn M.D.   On: 05/03/2022 18:59   MR 3D Recon At Scanner  Result Date: 05/03/2022 CLINICAL DATA:  Abdominal pain after cholecystectomy. Jaundice. Postop day 2. EXAM: MRI ABDOMEN WITHOUT AND WITH CONTRAST (INCLUDING MRCP) TECHNIQUE: Multiplanar multisequence MR imaging of the abdomen was performed both before and after the administration of intravenous contrast. Heavily T2-weighted images of the biliary and pancreatic ducts were obtained, and three-dimensional MRCP images were rendered by post processing. CONTRAST:  57m GADAVIST GADOBUTROL 1 MMOL/ML IV SOLN COMPARISON:  CT 05/03/2022.  Ultrasound 04/30/2022. FINDINGS: Portions of exam are mildly motion degraded. Lower chest: Trace right pleural fluid. Bibasilar atelectasis. Bilateral breast implants. Hepatobiliary: Normal noncontrast appearance of the liver. Cholecystectomy. No postoperative fluid collection. Mild intrahepatic biliary duct dilatation for prior cholecystectomy. Example left hepatic duct of 5 mm on 28/5. The common duct measures 9 mm in the porta hepatis and tapers gradually above the ampulla. Within the dependent distal common duct, subtle T2 hypointensity which is suspicious for multiple tiny stones. Only readily apparent on image 22/3 and image 50/13. Pancreas:  Normal, without mass or ductal dilatation. Spleen:  Normal in size, without focal abnormality. Adrenals/Urinary Tract: Normal adrenal glands. Normal kidneys,  without hydronephrosis. Stomach/Bowel: Normal stomach and abdominal bowel loops. Vascular/Lymphatic: Normal caliber of the aorta and branch vessels. No retroperitoneal or retrocrural adenopathy. Other: No abdominal ascites. Again identified is right-sided abdominal wall edema which is likely postoperative. No well-defined fluid collection. Musculoskeletal: No acute osseous abnormality. IMPRESSION: 1. Status post cholecystectomy with mild intrahepatic and borderline extrahepatic common duct dilatation. Suspicion of multiple tiny dependent distal common duct stones. Consider further evaluation with ERCP. 2. No postoperative fluid collection or evidence of pancreatitis. 3. Trace right pleural fluid and bibasilar atelectasis. Electronically Signed   By: KAbigail MiyamotoM.D.   On: 05/03/2022 20:23   DG ERCP  Result Date: 05/04/2022 CLINICAL DATA:  41year old female abdominal pain status post cholecystectomy. EXAM: ERCP TECHNIQUE: Multiple spot images obtained with the fluoroscopic device and submitted for interpretation post-procedure. FLUOROSCOPY TIME:  Fluoroscopy Time:  57 seconds Radiation Exposure Index (if provided by the fluoroscopic device): 15.09 mGy Number of Acquired Spot Images: 3 COMPARISON:  05/03/2022 FINDINGS: Retrograde cannulation of the common bile duct. Cholangiogram demonstrates mild diffuse extrahepatic biliary ductal dilation without definite filling defect. A balloon sweep was performed. IMPRESSION: Mild diffuse extrahepatic biliary ductal dilation. Balloon sweep was performed. These images were submitted for radiologic interpretation only. Please see the procedural report for full procedural details, the amount of contrast, and the fluoroscopy time utilized. DRuthann Cancer MD [facility] Electronically Signed   By: DRuthann CancerM.D.   On: 05/04/2022 10:44   MR ABDOMEN MRCP W WO CONTAST  Result Date: 05/03/2022 CLINICAL DATA:  Abdominal pain after cholecystectomy. Jaundice. Postop day 2. EXAM: MRI ABDOMEN WITHOUT AND WITH CONTRAST (INCLUDING MRCP) TECHNIQUE: Multiplanar multisequence MR imaging of the abdomen was performed both before and after the administration of intravenous contrast. Heavily T2-weighted images of the biliary and pancreatic ducts were obtained, and three-dimensional MRCP images were rendered by post processing. CONTRAST:  52m GADAVIST GADOBUTROL 1 MMOL/ML IV SOLN COMPARISON:  CT 05/03/2022.  Ultrasound 04/30/2022. FINDINGS: Portions of exam are mildly motion degraded. Lower chest: Trace right pleural fluid. Bibasilar atelectasis. Bilateral breast implants. Hepatobiliary: Normal noncontrast appearance of the liver. Cholecystectomy. No postoperative fluid collection. Mild intrahepatic biliary duct dilatation for prior cholecystectomy. Example left hepatic duct of 5 mm on 28/5. The common duct measures 9 mm in the porta hepatis and tapers gradually above the ampulla. Within the dependent distal common duct, subtle T2 hypointensity which is suspicious for multiple tiny stones. Only readily apparent on image 22/3 and image 50/13. Pancreas:  Normal, without mass or ductal dilatation. Spleen:  Normal in size, without focal abnormality. Adrenals/Urinary Tract: Normal adrenal glands. Normal kidneys, without hydronephrosis. Stomach/Bowel: Normal stomach and abdominal bowel loops. Vascular/Lymphatic: Normal caliber of the aorta and branch vessels. No retroperitoneal or retrocrural adenopathy. Other: No abdominal ascites. Again identified is right-sided abdominal wall edema which is likely postoperative. No well-defined fluid collection. Musculoskeletal: No acute osseous abnormality. IMPRESSION: 1. Status post cholecystectomy with mild intrahepatic and borderline extrahepatic common duct dilatation. Suspicion of multiple tiny dependent distal common duct stones. Consider further evaluation with ERCP. 2. No postoperative fluid  collection or evidence of pancreatitis. 3. Trace right pleural fluid and bibasilar atelectasis. Electronically Signed   By: KAbigail MiyamotoM.D.   On: 05/03/2022 20:23    Anti-infectives: Anti-infectives (From admission, onward)    None        Assessment/Plan POD4 s/p laparoscopic cholecystectomy  Choledocholithiasis S/P ERCP with sphincterotomy today by Dr. MWatt Climes- lipase elevated this AM to >900 - Tbili trending down  - post-ERCP pancreatitis - ordered aggressive IV hydration and drop diet back to CLD, if patient has pain with CLD may need to go to just ice chips - discussed with GI, do not feel any imaging acutely will change management but will continue to monitor  - appreciate GI assistance  - not ready for discharge  ?Hematuria - check UA today   FEN: CLD, IVF'@150'$  cc/h VTE: LMWH ID: no current abx  LOS: 0 days     KNorm Parcel PAr[facility] SurgerySur[facility] 05/05/2022, 8:29 AM Please see Amion for pager number during day hours 7:00am-4:30pm

## 2022-05-05 NOTE — Progress Notes (Signed)
[facility] Gastroenterology Progress Note  Felicia Cole 41 y.o. 1981/10/23  CC:  Elevated LFTs   Subjective: Patient resting in bed this morning, husband at bedside.  Tearful and appears uncomfortable.  Patient states she began having severe epigastric pain around 0100 this morning.  No relief with oxycodone.  Has been on clear liquid diet.  Requesting IV pain medication.  Denies nausea, vomiting.  Reports fever of 100.5, no fever documented in patient's chart.  Denies chills, has not had a bowel movement.  Reports hematuria.  ROS : Review of Systems  Constitutional:  Positive for fever (100.5 at 2200 per patient, not in chart). Negative for chills.  Gastrointestinal:  Positive for abdominal pain (epigastric). Negative for blood in stool, constipation, diarrhea, heartburn, melena, nausea and vomiting.  Genitourinary:  Positive for hematuria. Negative for dysuria.  Musculoskeletal:  Negative for falls.     Objective: Vital signs in last 24 hours: Vitals:   05/05/22 0129 05/05/22 0527  BP: 102/72 108/69  Pulse: 85 79  Resp: 17 17  Temp: 98.5 F (36.9 C) 98.4 F (36.9 C)  SpO2: 97% 97%    Physical Exam:  General:  Alert, cooperative, appears uncomfortable, tearful  Head:  Normocephalic, without obvious abnormality, atraumatic  Eyes:  Anicteric sclera, EOM's intact  Lungs:   Clear to auscultation bilaterally, respirations unlabored  Heart:  Regular rate and rhythm, S1, S2 normal  Abdomen:   Soft, tender to palpation of epigastrium, there is guarding but no rebound tenderness, bowel sounds active all four quadrants  Extremities: Extremities normal, atraumatic, no  edema  Pulses: 2+ and symmetric    Lab Results: Recent Labs    05/04/22 0335 05/05/22 0345  NA 137 136  K 3.5 3.4*  CL 107 105  CO2 24 23  GLUCOSE 105* 133*  BUN 6 6  CREATININE 0.52 0.71  CALCIUM 8.8* 8.7*   Recent Labs    05/04/22 0335 05/05/22 0345  AST 125* 128*  ALT 416* 374*  ALKPHOS 170* 244*   BILITOT 4.1* 2.6*  PROT 6.8 7.1  ALBUMIN 3.6 3.7   Recent Labs    05/03/22 1750 05/05/22 0345  WBC 7.5 10.3  NEUTROABS 5.8 7.3  HGB 12.5 12.5  HCT 38.1 37.8  MCV 94.1 93.3  PLT 224 255   No results for input(s): LABPROT, INR in the last 72 hours.    Assessment Elevated LFTs - S/p lap chole - S/p ERCP 05/04/2022 - Choledocholithiasis found.  Complete removal was accomplished by biliary sphincterotomy and balloon extraction.  1 pancreatic stent placed in the ventral pancreatic duct.  Biliary tree swept and nothing found at the end of procedure. - AST 128, ALT 374, improving - Alk phos 244, increased -Total bilirubin 2.6, improving - Lipase elevated to 957 this morning, increased from 68 on 05/03/2022, patient having epigastric pain, onset 0100 05/05/22 -Patient reports temperature 100.5 last night, no fever documented in patient's chart. -No leukocytosis or anemia.  Plan: Post-ERCP pancreatitis likely.  Surgical team has seen patient this morning, starting IV fluids and IV pain medication. Keep patient on clear liquid diet for now.  Case discussed with Dr. Watt Climes and surgical team and do not feel imaging needed at this time.  Will continue to monitor. Urinalysis ordered to address hematuria. Continue supportive care. [facility] will follow.  Angelique Holm PA-C 05/05/2022, 9:13 AM  Contact #  [phone]

## 2022-05-05 NOTE — Progress Notes (Signed)
Rm 1336, Felicia Cole, 71 had ERCP yesterday, Has c/o since one am. Wants stat  CT. scan bec she feels there's somehting wrong.

## 2022-05-05 NOTE — Progress Notes (Signed)
Patient has complained of abdominal pain since about 0100.  Pain medication given, She stated that it feels more like spasms.  This morning she said she had some hematuria.  But nurse did not see it.  Will report to oncoming nurse.

## 2022-05-06 ENCOUNTER — Inpatient Hospital Stay (HOSPITAL_COMMUNITY): Payer: No Typology Code available for payment source

## 2022-05-06 LAB — CBC
HCT: 35.8 % — ABNORMAL LOW (ref 36.0–46.0)
Hemoglobin: 11.6 g/dL — ABNORMAL LOW (ref 12.0–15.0)
MCH: 31.4 pg (ref 26.0–34.0)
MCHC: 32.4 g/dL (ref 30.0–36.0)
MCV: 97 fL (ref 80.0–100.0)
Platelets: 243 10*3/uL (ref 150–400)
RBC: 3.69 MIL/uL — ABNORMAL LOW (ref 3.87–5.11)
RDW: 13.2 % (ref 11.5–15.5)
WBC: 16.5 10*3/uL — ABNORMAL HIGH (ref 4.0–10.5)
nRBC: 0 % (ref 0.0–0.2)

## 2022-05-06 LAB — COMPREHENSIVE METABOLIC PANEL
ALT: 291 U/L — ABNORMAL HIGH (ref 0–44)
AST: 102 U/L — ABNORMAL HIGH (ref 15–41)
Albumin: 3.2 g/dL — ABNORMAL LOW (ref 3.5–5.0)
Alkaline Phosphatase: 229 U/L — ABNORMAL HIGH (ref 38–126)
Anion gap: 8 (ref 5–15)
BUN: 10 mg/dL (ref 6–20)
CO2: 24 mmol/L (ref 22–32)
Calcium: 8.7 mg/dL — ABNORMAL LOW (ref 8.9–10.3)
Chloride: 105 mmol/L (ref 98–111)
Creatinine, Ser: 0.67 mg/dL (ref 0.44–1.00)
GFR, Estimated: >60 mL/min (ref >60)
Glucose, Bld: 94 mg/dL (ref 70–99)
Potassium: 3.9 mmol/L (ref 3.5–5.1)
Sodium: 137 mmol/L (ref 135–145)
Total Bilirubin: 3.3 mg/dL — ABNORMAL HIGH (ref 0.3–1.2)
Total Protein: 6.4 g/dL — ABNORMAL LOW (ref 6.5–8.1)

## 2022-05-06 LAB — LIPASE, BLOOD: Lipase: 1123 U/L — ABNORMAL HIGH (ref 11–51)

## 2022-05-06 MED ORDER — IOHEXOL 350 MG/ML SOLN
100.0000 mL | Freq: Once | INTRAVENOUS | Status: AC | PRN
  Administered 2022-05-06: 80 mL via INTRAVENOUS

## 2022-05-06 MED ORDER — IOHEXOL 9 MG/ML PO SOLN
ORAL | Status: AC
  Filled 2022-05-06: qty 1000

## 2022-05-06 MED ORDER — PIPERACILLIN-TAZOBACTAM 3.375 G IVPB 30 MIN
3.3750 g | Freq: Three times a day (TID) | INTRAVENOUS | Status: AC
  Administered 2022-05-06 – 2022-05-10 (×11): 3.375 g via INTRAVENOUS
  Filled 2022-05-06 (×12): qty 50

## 2022-05-06 MED ORDER — IOHEXOL 9 MG/ML PO SOLN
500.0000 mL | ORAL | Status: AC
  Administered 2022-05-06 (×2): 500 mL via ORAL

## 2022-05-06 NOTE — Progress Notes (Signed)
Progress Note  2 Days Post-Op  Subjective: Pt with epigastric and low back pain. Decreased UOP yesterday and tachycardic overnight. Diaphoretic overnight, afebrile. Patient reports worsened pain with PO intake. GI has already been by this AM and discussed CT with patient.   Objective: Vital signs in last 24 hours: Temp:  [98 F (36.7 C)-99 F (37.2 C)] 99 F (37.2 C) (06/07 0535) Pulse Rate:  [108-130] 122 (06/07 0535) Resp:  [17-20] 17 (06/07 0535) BP: (102-111)/(68-78) 105/78 (06/07 0535) SpO2:  [80 %-94 %] 80 % (06/07 0535) Last BM Date : 05/03/22  Intake/Output from previous day: 06/06 0701 - 06/07 0700 In: 3324.1 [P.O.:120; I.V.:3104.1; IV Piggyback:100] Out: 225 [Urine:225] Intake/Output this shift: No intake/output data recorded.  PE: General: pleasant, WD, WN female who is laying in bed in NAD Lungs: Respiratory effort nonlabored Abd: soft, ND, ttp in epigastrium  Skin: jaundice improving  Psych: A&Ox3 with an appropriate affect.    Lab Results:  Recent Labs    05/05/22 0345 05/06/22 0321  WBC 10.3 16.5*  HGB 12.5 11.6*  HCT 37.8 35.8*  PLT 255 243   BMET Recent Labs    05/05/22 0345 05/06/22 0321  NA 136 137  K 3.4* 3.9  CL 105 105  CO2 23 24  GLUCOSE 133* 94  BUN 6 10  CREATININE 0.71 0.67  CALCIUM 8.7* 8.7*   PT/INR No results for input(s): LABPROT, INR in the last 72 hours. CMP     Component Value Date/Time   NA 137 05/06/2022 0321   K 3.9 05/06/2022 0321   CL 105 05/06/2022 0321   CO2 24 05/06/2022 0321   GLUCOSE 94 05/06/2022 0321   BUN 10 05/06/2022 0321   CREATININE 0.67 05/06/2022 0321   CREATININE 0.83 05/11/2019 0846   CALCIUM 8.7 (L) 05/06/2022 0321   PROT 6.4 (L) 05/06/2022 0321   ALBUMIN 3.2 (L) 05/06/2022 0321   AST 102 (H) 05/06/2022 0321   ALT 291 (H) 05/06/2022 0321   ALKPHOS 229 (H) 05/06/2022 0321   BILITOT 3.3 (H) 05/06/2022 0321   GFRNONAA >60 05/06/2022 0321   GFRNONAA 90 05/11/2019 0846   GFRAA 104  05/11/2019 0846   Lipase     Component Value Date/Time   LIPASE 1,123 (H) 05/06/2022 0321       Studies/Results: DG ERCP  Result Date: 05/04/2022 CLINICAL DATA:  41 year old female abdominal pain status post cholecystectomy. EXAM: ERCP TECHNIQUE: Multiple spot images obtained with the fluoroscopic device and submitted for interpretation post-procedure. FLUOROSCOPY TIME:  Fluoroscopy Time:  57 seconds Radiation Exposure Index (if provided by the fluoroscopic device): 15.09 mGy Number of Acquired Spot Images: 3 COMPARISON:  05/03/2022 FINDINGS: Retrograde cannulation of the common bile duct. Cholangiogram demonstrates mild diffuse extrahepatic biliary ductal dilation without definite filling defect. A balloon sweep was performed. IMPRESSION: Mild diffuse extrahepatic biliary ductal dilation. Balloon sweep was performed. These images were submitted for radiologic interpretation only. Please see the procedural report for full procedural details, the amount of contrast, and the fluoroscopy time utilized. Ruthann Cancer, MD [facility] Electronically Signed   By: Ruthann Cancer M.D.   On: 05/04/2022 10:44    Anti-infectives: Anti-infectives (From admission, onward)    None        Assessment/Plan POD5 s/p laparoscopic cholecystectomy  Choledocholithiasis S/P ERCP with sphincterotomy 6/5 by Dr. Watt Climes Post-ERCP pancreatitis  - lipase up to 1100 from 900 yesterday  - Tbili up slightly again, continue to monitor  -  UOP lower overnight and pt tachycardic, increase IVF to 200 cc/h - ice chips and sips of clears as tolerated  - appreciate GI assistance - per pt planning possible CT today  - not ready for discharge  ?Hematuria - UA yesterday with hematuria and some trace leukocytes and rare bacteria, UCx sent as well    FEN: ice chips and sips, IVF'@200'$  cc/h VTE: LMWH ID: no current abx  LOS: 1 day     Norm Parcel, [facility] 05/06/2022, 8:37 AM Please see Amion for pager number during day hours 7:00am-4:30pm

## 2022-05-06 NOTE — Progress Notes (Signed)
Notifed PA of patient's vitals. Patient's oxygen sats low, patient taking shallow breaths, reports pain increases in abdomen during deep inspiration. Patient experiencing increasing anxiety r/t pain and vitals. CTA ordered to r/o PE. Will apply oxygen  for comfort as needed and encourage IS use.

## 2022-05-06 NOTE — Progress Notes (Signed)
Patient able to take in about 653m of contrast but unable to take more r/t feeling of fullness, distention in abdomen. Noted abdo rounded, taut. KBarkley BoardsPA notified. CT notified of contrast amt tolerated. Awaiting CT. Will cont to monitor.

## 2022-05-06 NOTE — Plan of Care (Signed)
Plan of care reviewed and discussed with the patient. 

## 2022-05-06 NOTE — Progress Notes (Signed)
Enzo Bi 11:04 AM  Subjective: Patient actually says her pain is less and she is tolerating clear liquids and we discussed her urine which is more bilirubin and no obvious blood on dipstick and we answered all of her and her family's questions and she has had some fever spikes but is walking the halls and patient discussed with our PA as well  Objective: Vital signs stable except for pulse temperature up and down abdomen is soft minimal midepigastric discomfort no rebound white count increase lipase increase slight increased bilirubin other transaminases decreased BUN and creatinine okay  Assessment: Post ERCP pancreatitis  Plan: We will proceed with CT and begin antibiotics and repeat labs tomorrow and continue clear liquids only for now  [facility]  office [phone] After 5PM or if no answer call [phone]

## 2022-05-07 ENCOUNTER — Inpatient Hospital Stay (HOSPITAL_COMMUNITY): Payer: No Typology Code available for payment source

## 2022-05-07 LAB — COMPREHENSIVE METABOLIC PANEL
ALT: 186 U/L — ABNORMAL HIGH (ref 0–44)
AST: 48 U/L — ABNORMAL HIGH (ref 15–41)
Albumin: 2.7 g/dL — ABNORMAL LOW (ref 3.5–5.0)
Alkaline Phosphatase: 175 U/L — ABNORMAL HIGH (ref 38–126)
Anion gap: 10 (ref 5–15)
BUN: 8 mg/dL (ref 6–20)
CO2: 27 mmol/L (ref 22–32)
Calcium: 8.2 mg/dL — ABNORMAL LOW (ref 8.9–10.3)
Chloride: 97 mmol/L — ABNORMAL LOW (ref 98–111)
Creatinine, Ser: 0.57 mg/dL (ref 0.44–1.00)
GFR, Estimated: >60 mL/min (ref >60)
Glucose, Bld: 100 mg/dL — ABNORMAL HIGH (ref 70–99)
Potassium: 3.8 mmol/L (ref 3.5–5.1)
Sodium: 134 mmol/L — ABNORMAL LOW (ref 135–145)
Total Bilirubin: 2.5 mg/dL — ABNORMAL HIGH (ref 0.3–1.2)
Total Protein: 5.9 g/dL — ABNORMAL LOW (ref 6.5–8.1)

## 2022-05-07 LAB — CBC
HCT: 32 % — ABNORMAL LOW (ref 36.0–46.0)
Hemoglobin: 10.5 g/dL — ABNORMAL LOW (ref 12.0–15.0)
MCH: 31.4 pg (ref 26.0–34.0)
MCHC: 32.8 g/dL (ref 30.0–36.0)
MCV: 95.8 fL (ref 80.0–100.0)
Platelets: 241 10*3/uL (ref 150–400)
RBC: 3.34 MIL/uL — ABNORMAL LOW (ref 3.87–5.11)
RDW: 13.2 % (ref 11.5–15.5)
WBC: 19.1 10*3/uL — ABNORMAL HIGH (ref 4.0–10.5)
nRBC: 0 % (ref 0.0–0.2)

## 2022-05-07 LAB — URINE CULTURE: Culture: NO GROWTH

## 2022-05-07 LAB — LIPASE, BLOOD: Lipase: 205 U/L — ABNORMAL HIGH (ref 11–51)

## 2022-05-07 MED ORDER — NALOXONE HCL 0.4 MG/ML IJ SOLN
0.4000 mg | INTRAMUSCULAR | Status: DC | PRN
Start: 2022-05-07 — End: 2022-05-08

## 2022-05-07 MED ORDER — BOOST / RESOURCE BREEZE PO LIQD CUSTOM
1.0000 | Freq: Three times a day (TID) | ORAL | Status: DC
  Administered 2022-05-07: 1 via ORAL

## 2022-05-07 MED ORDER — DIPHENHYDRAMINE HCL 12.5 MG/5ML PO ELIX: 12.5000 mg | ORAL_SOLUTION | Freq: Four times a day (QID) | ORAL | Status: DC | PRN

## 2022-05-07 MED ORDER — SODIUM CHLORIDE 0.9% FLUSH: 9.0000 mL | INTRAVENOUS | Status: DC | PRN

## 2022-05-07 MED ORDER — FUROSEMIDE 10 MG/ML IJ SOLN
40.0000 mg | Freq: Once | INTRAMUSCULAR | Status: AC
  Administered 2022-05-07: 40 mg via INTRAVENOUS
  Filled 2022-05-07 (×2): qty 4

## 2022-05-07 MED ORDER — PANCRELIPASE (LIP-PROT-AMYL) 12000-38000 UNITS PO CPEP
24000.0000 [IU] | ORAL_CAPSULE | Freq: Three times a day (TID) | ORAL | Status: DC
  Administered 2022-05-07 – 2022-05-10 (×8): 24000 [IU] via ORAL
  Filled 2022-05-07 (×8): qty 2

## 2022-05-07 MED ORDER — DIPHENHYDRAMINE HCL 50 MG/ML IJ SOLN: 12.5000 mg | Freq: Four times a day (QID) | INTRAMUSCULAR | Status: DC | PRN

## 2022-05-07 MED ORDER — HYDROMORPHONE 1 MG/ML IV SOLN
INTRAVENOUS | Status: DC
  Administered 2022-05-07: 4.8 mg via INTRAVENOUS
  Administered 2022-05-07: 30 mg via INTRAVENOUS
  Administered 2022-05-07 – 2022-05-08 (×2): 3 mg via INTRAVENOUS
  Filled 2022-05-07: qty 30

## 2022-05-07 MED ORDER — HYDROXYZINE HCL 25 MG PO TABS: 25.0000 mg | ORAL_TABLET | Freq: Every evening | ORAL | Status: DC | PRN

## 2022-05-07 MED ORDER — ONDANSETRON HCL 4 MG/2ML IJ SOLN: 4.0000 mg | Freq: Four times a day (QID) | INTRAMUSCULAR | Status: DC | PRN

## 2022-05-07 NOTE — Plan of Care (Signed)
  Problem: Clinical Measurements: Goal: Ability to maintain clinical measurements within normal limits will improve Outcome: Progressing   Problem: Pain Managment: Goal: General experience of comfort will improve Outcome: Progressing   Problem: Safety: Goal: Ability to remain free from injury will improve Outcome: Progressing   

## 2022-05-07 NOTE — Progress Notes (Signed)
[facility] Gastroenterology Progress Note  Felicia Cole 41 y.o. [birth year]  CC:  Elevated LFTs, post-ERCP pancreatitis   Subjective: Patient resting in bed this morning, husband at bedside.  She has continued to have severe epigastric pain with radiation to her back, now has PCA pump for pain control.  She has been up walking the halls as tolerated but has been noting weakness and shortness of breath after exertion with drop in O2 sats, now on supplemental oxygen, 3 L per nasal cannula.  Has been n.p.o. since yesterday morning except ice chips and sips.  Still reports decreased urine output as well as constipation, denies bowel movement overnight.  Temperature around 52F to 100 F overnight.  Reports pain is currently 4/10 but has been 10/10 at worst.    ROS : Review of Systems  Constitutional:  Positive for fever. Negative for chills.  HENT:  Negative for sore throat.   Respiratory:  Positive for shortness of breath. Negative for stridor.   Gastrointestinal:  Positive for abdominal pain and constipation. Negative for blood in stool, diarrhea, heartburn, melena, nausea and vomiting.  Genitourinary:        Decreased urine output  Musculoskeletal:  Negative for falls.  Neurological:  Positive for weakness. Negative for loss of consciousness.      Objective: Vital signs in last 24 hours: Vitals:   05/07/22 0536 05/07/22 1005  BP: 108/67 111/74  Pulse: (!) 112 (!) 119  Resp: 17 18  Temp: 98 F (36.7 C) 98.1 F (36.7 C)  SpO2: 90% (!) 78%    Physical Exam:  General:  Alert, cooperative, no distress, appears stated age  Head:  Normocephalic, without obvious abnormality, atraumatic  Eyes:  Anicteric sclera, EOM's intact  Lungs:   Clear to auscultation bilaterally, respirations unlabored  Heart:  Regular rate and rhythm, S1, S2 normal  Abdomen:   Soft, non-tender, bowel sounds active all four quadrants,  no masses,   Extremities: Extremities normal, atraumatic, no  edema  Pulses: 2+  and symmetric    Lab Results: Recent Labs    05/06/22 0321 05/07/22 0336  NA 137 134*  K 3.9 3.8  CL 105 97*  CO2 24 27  GLUCOSE 94 100*  BUN 10 8  CREATININE 0.67 0.57  CALCIUM 8.7* 8.2*   Recent Labs    05/06/22 0321 05/07/22 0336  AST 102* 48*  ALT 291* 186*  ALKPHOS 229* 175*  BILITOT 3.3* 2.5*  PROT 6.4* 5.9*  ALBUMIN 3.2* 2.7*   Recent Labs    05/05/22 0345 05/06/22 0321 05/07/22 0336  WBC 10.3 16.5* 19.1*  NEUTROABS 7.3  --   --   HGB 12.5 11.6* 10.5*  HCT 37.8 35.8* 32.0*  MCV 93.3 97.0 95.8  PLT 255 243 241   No results for input(s): "LABPROT", "INR" in the last 72 hours.    Assessment Elevated LFTs, post-ERCP pancreatitis - S/p lap chole - S/p ERCP 05/04/2022 - Choledocholithiasis found.  Complete removal was accomplished by biliary sphincterotomy and balloon extraction.  1 pancreatic stent placed in the ventral pancreatic duct.  Biliary tree swept and nothing found at the end of procedure. -CT abdomen/pelvis/CTA chest 05/06/2022 shows no evidence of pulmonary embolism.  Trace bilateral pleural effusions and patchy atelectasis of the bilateral lower lobes.  Evidence of acute pancreatitis without enhancing fluid collections or pancreatic necrosis.  New catheter fragment measuring 4 cm identified mid small bowel.  No bowel obstruction.  Small amount of ascites.  - Lipase down to 205  this morning, improved from yesterday - AST 48, ALT 186, alk phos 175, T. bili 2.5, all improving - Leukocytosis with WBC 19.1 (16.5) - Hemoglobin 10.5 (11.6)  Plan: Discussed with Dr. Watt Climes.  Patient with post-ERCP pancreatitis with improved lipase today and LFTs/bilirubin downtrending.  White count increased today.  We will continue to follow.  To address nutritional needs recommend advancing patient to clear liquid diet with supplemental pancreatic enzymes.  Continue pain control with PCA pump.  Will round again on patient in the afternoon to monitor pain control. Surgical  team starting Lasix and encouraging incentive spirometry for hypoxia. Continue supportive care. Continue to trend LFTs. [facility] will follow.  Angelique Holm PA-C 05/07/2022, 11:42 AM  Contact #  [phone]

## 2022-05-07 NOTE — Plan of Care (Signed)
Plan of care reviewed and discussed with the patient. 

## 2022-05-07 NOTE — Progress Notes (Signed)
Progress Note  3 Days Post-Op  Subjective: Pt with continued epigastric abdominal pain. She denies nausea but is distended and only passing a little flatus. UOP still low. Using IS but pain with deep inspiration. No LE edema.   Objective: Vital signs in last 24 hours: Temp:  [98 F (36.7 C)-100 F (37.8 C)] 98.1 F (36.7 C) (06/08 1005) Pulse Rate:  [112-129] 119 (06/08 1005) Resp:  [17-20] 18 (06/08 1005) BP: (108-114)/(67-74) 111/74 (06/08 1005) SpO2:  [73 %-92 %] 78 % (06/08 1005) Last BM Date : 05/04/22  Intake/Output from previous day: 06/07 0701 - 06/08 0700 In: 4288.3 [P.O.:50; I.V.:4138.2; IV Piggyback:100] Out: -  Intake/Output this shift: No intake/output data recorded.  PE: General: pleasant, WD, WN female who is sitting up in NAD Lungs: Respiratory effort nonlabored on supplemental oxygen Abd: soft, mild to moderate distention, BS hypoactive, ttp in epigastrium, incisions C/D/I Skin: jaundice improving  Psych: A&Ox3 with an appropriate affect.    Lab Results:  Recent Labs    05/06/22 0321 05/07/22 0336  WBC 16.5* 19.1*  HGB 11.6* 10.5*  HCT 35.8* 32.0*  PLT 243 241   BMET Recent Labs    05/06/22 0321 05/07/22 0336  NA 137 134*  K 3.9 3.8  CL 105 97*  CO2 24 27  GLUCOSE 94 100*  BUN 10 8  CREATININE 0.67 0.57  CALCIUM 8.7* 8.2*   PT/INR No results for input(s): "LABPROT", "INR" in the last 72 hours. CMP     Component Value Date/Time   NA 134 (L) 05/07/2022 0336   K 3.8 05/07/2022 0336   CL 97 (L) 05/07/2022 0336   CO2 27 05/07/2022 0336   GLUCOSE 100 (H) 05/07/2022 0336   BUN 8 05/07/2022 0336   CREATININE 0.57 05/07/2022 0336   CREATININE 0.83 05/11/2019 0846   CALCIUM 8.2 (L) 05/07/2022 0336   PROT 5.9 (L) 05/07/2022 0336   ALBUMIN 2.7 (L) 05/07/2022 0336   AST 48 (H) 05/07/2022 0336   ALT 186 (H) 05/07/2022 0336   ALKPHOS 175 (H) 05/07/2022 0336   BILITOT 2.5 (H) 05/07/2022 0336   GFRNONAA >60 05/07/2022 0336   GFRNONAA  90 05/11/2019 0846   GFRAA 104 05/11/2019 0846   Lipase     Component Value Date/Time   LIPASE 205 (H) 05/07/2022 0336       Studies/Results: DG CHEST PORT 1 VIEW  Result Date: 05/07/2022 CLINICAL DATA:  Hypoxia. History of asthma. EXAM: PORTABLE CHEST 1 VIEW COMPARISON:  Chest CT yesterday FINDINGS: Persistent and possibly worsened atelectasis/infiltrate in both lower lungs, possibly with small pleural effusions. Upper lungs remain clear. IMPRESSION: Persistent and possibly worsened atelectasis/infiltrate in both lower lungs, possibly with small pleural effusions. Electronically Signed   By: Nelson Chimes M.D.   On: 05/07/2022 08:35   CT ABDOMEN PELVIS W CONTRAST  Result Date: 05/06/2022 CLINICAL DATA:  Acute severe epigastric pain.  ERCP yesterday. EXAM: CT ANGIOGRAPHY CHEST CT ABDOMEN AND PELVIS WITH CONTRAST TECHNIQUE: Multidetector CT imaging of the chest was performed using the standard protocol during bolus administration of intravenous contrast. Multiplanar CT image reconstructions and MIPs were obtained to evaluate the vascular anatomy. Multidetector CT imaging of the abdomen and pelvis was performed using the standard protocol during bolus administration of intravenous contrast. RADIATION DOSE REDUCTION: This exam was performed according to the departmental dose-optimization program which includes automated exposure control, adjustment of the mA and/or kV according to patient size and/or use of iterative reconstruction technique. CONTRAST:  29m OMNIPAQUE  IOHEXOL 350 MG/ML SOLN COMPARISON:  MRI abdomen 05/03/2022. CT abdomen and pelvis 05/03/2022. FINDINGS: CTA CHEST FINDINGS Cardiovascular: Satisfactory opacification of the pulmonary arteries to the segmental level. No evidence of pulmonary embolism. Normal heart size. No pericardial effusion. Mediastinum/Nodes: No enlarged mediastinal, hilar, or axillary lymph nodes. Thyroid gland, trachea, and esophagus demonstrate no significant  findings. Lungs/Pleura: There are small bilateral pleural effusions. There is bilateral lower lobe patchy airspace disease. No evidence for pneumothorax. Trachea and central airways are patent. Musculoskeletal: Bilateral breast implants are present. No acute fractures. Review of the MIP images confirms the above findings. CT ABDOMEN and PELVIS FINDINGS Hepatobiliary: Patient is status post cholecystectomy. There is no fluid collection in the gallbladder fossa. Small amount of pneumobilia is present likely related to recent ERCP. There is no biliary ductal dilatation. Pancreas: There is mild diffuse fat stranding and fluid surrounding the pancreas. The pancreas enhances normally. There is no ductal dilatation. No enhancing fluid collections are identified. Spleen: Normal in size without focal abnormality. Adrenals/Urinary Tract: Adrenal glands are unremarkable. Kidneys are normal, without renal calculi, focal lesion, or hydronephrosis. Bladder is unremarkable. Stomach/Bowel: There is no evidence for bowel obstruction. The appendix is within normal limits. There is no focal wall thickening identified. The stomach is nondilated. There is a trace amount of free air adjacent to the appendix image 2/31. Catheter fragment is seen within nondilated small bowel in the mid abdomen image 2/66. This measures approximally 4 cm in length. Vascular/Lymphatic: No significant vascular findings are present. No enlarged abdominal or pelvic lymph nodes. Reproductive: Uterus and bilateral adnexa are unremarkable. Other: There is a small amount of free fluid in the pelvis. No focal abdominal wall hernia. There is small amount of air in the anterior abdominal wall compatible with recent surgery. There is also some mild body wall edema likely related to recent surgery. Musculoskeletal: No acute or significant osseous findings. Review of the MIP images confirms the above findings. IMPRESSION: 1. No evidence for pulmonary embolism. 2. Trace  bilateral pleural effusions. 3. Patchy atelectasis and airspace disease in the bilateral lower lobes. Correlate clinically for pneumonia. 4. Findings compatible with acute pancreatitis. No enhancing fluid collections or pancreatic necrosis. 5. Trace free air adjacent to the pancreas is most likely related to recent abdominal surgery. Correlate clinically for other etiologies. 6. Status post cholecystectomy. No fluid collection in the gallbladder fossa. Pneumobilia. 7. New catheter fragment measuring 4 cm identified in mid small bowel. Please correlate clinically. No bowel obstruction. 8. Small amount of ascites. Electronically Signed   By: Ronney Asters M.D.   On: 05/06/2022 15:37   CT Angio Chest Pulmonary Embolism (PE) W or WO Contrast  Result Date: 05/06/2022 CLINICAL DATA:  Acute severe epigastric pain.  ERCP yesterday. EXAM: CT ANGIOGRAPHY CHEST CT ABDOMEN AND PELVIS WITH CONTRAST TECHNIQUE: Multidetector CT imaging of the chest was performed using the standard protocol during bolus administration of intravenous contrast. Multiplanar CT image reconstructions and MIPs were obtained to evaluate the vascular anatomy. Multidetector CT imaging of the abdomen and pelvis was performed using the standard protocol during bolus administration of intravenous contrast. RADIATION DOSE REDUCTION: This exam was performed according to the departmental dose-optimization program which includes automated exposure control, adjustment of the mA and/or kV according to patient size and/or use of iterative reconstruction technique. CONTRAST:  74m OMNIPAQUE IOHEXOL 350 MG/ML SOLN COMPARISON:  MRI abdomen 05/03/2022. CT abdomen and pelvis 05/03/2022. FINDINGS: CTA CHEST FINDINGS Cardiovascular: Satisfactory opacification of the pulmonary arteries to the segmental level.  No evidence of pulmonary embolism. Normal heart size. No pericardial effusion. Mediastinum/Nodes: No enlarged mediastinal, hilar, or axillary lymph nodes. Thyroid  gland, trachea, and esophagus demonstrate no significant findings. Lungs/Pleura: There are small bilateral pleural effusions. There is bilateral lower lobe patchy airspace disease. No evidence for pneumothorax. Trachea and central airways are patent. Musculoskeletal: Bilateral breast implants are present. No acute fractures. Review of the MIP images confirms the above findings. CT ABDOMEN and PELVIS FINDINGS Hepatobiliary: Patient is status post cholecystectomy. There is no fluid collection in the gallbladder fossa. Small amount of pneumobilia is present likely related to recent ERCP. There is no biliary ductal dilatation. Pancreas: There is mild diffuse fat stranding and fluid surrounding the pancreas. The pancreas enhances normally. There is no ductal dilatation. No enhancing fluid collections are identified. Spleen: Normal in size without focal abnormality. Adrenals/Urinary Tract: Adrenal glands are unremarkable. Kidneys are normal, without renal calculi, focal lesion, or hydronephrosis. Bladder is unremarkable. Stomach/Bowel: There is no evidence for bowel obstruction. The appendix is within normal limits. There is no focal wall thickening identified. The stomach is nondilated. There is a trace amount of free air adjacent to the appendix image 2/31. Catheter fragment is seen within nondilated small bowel in the mid abdomen image 2/66. This measures approximally 4 cm in length. Vascular/Lymphatic: No significant vascular findings are present. No enlarged abdominal or pelvic lymph nodes. Reproductive: Uterus and bilateral adnexa are unremarkable. Other: There is a small amount of free fluid in the pelvis. No focal abdominal wall hernia. There is small amount of air in the anterior abdominal wall compatible with recent surgery. There is also some mild body wall edema likely related to recent surgery. Musculoskeletal: No acute or significant osseous findings. Review of the MIP images confirms the above findings.  IMPRESSION: 1. No evidence for pulmonary embolism. 2. Trace bilateral pleural effusions. 3. Patchy atelectasis and airspace disease in the bilateral lower lobes. Correlate clinically for pneumonia. 4. Findings compatible with acute pancreatitis. No enhancing fluid collections or pancreatic necrosis. 5. Trace free air adjacent to the pancreas is most likely related to recent abdominal surgery. Correlate clinically for other etiologies. 6. Status post cholecystectomy. No fluid collection in the gallbladder fossa. Pneumobilia. 7. New catheter fragment measuring 4 cm identified in mid small bowel. Please correlate clinically. No bowel obstruction. 8. Small amount of ascites. Electronically Signed   By: Ronney Asters M.D.   On: 05/06/2022 15:37    Anti-infectives: Anti-infectives (From admission, onward)    Start     Dose/Rate Route Frequency Ordered Stop   05/06/22 0930  piperacillin-tazobactam (ZOSYN) IVPB 3.375 g        3.375 g 12.5 mL/hr over 240 Minutes Intravenous Every 8 hours 05/06/22 0846 05/10/22 0929        Assessment/Plan POD6 s/p laparoscopic cholecystectomy  Choledocholithiasis S/P ERCP with sphincterotomy 6/5 by Dr. Watt Climes Post-ERCP pancreatitis  - lipase down to 200 this AM from 1100 yesterday   - Tbili and LFTs trending down - UOP still low and pt tachycardic, continue IVF@ 200 cc/h - ice chips and sips of clears as tolerated - mild ileus, continue to monitor - CT yesterday with pancreatitis - no necrosis or pseudocyst - PCA for pain control today - not ready for discharge  ?Hematuria - UA with hematuria and some trace leukocytes and rare bacteria, UCx ordered today Hypoxia - on 3L via [place], CXR with atelectasis and small effusions, will give 40 lasix today and encouraged IS, continue to monitor  FEN: ice chips and sips, IVF'@200'$  cc/h VTE: LMWH ID: Zosyn 6/7>>  LOS: 2 days    Norm Parcel, [facility] Surgery 05/07/2022, 11:08 AM Please see Amion for pager  number during day hours 7:00am-4:30pm

## 2022-05-08 ENCOUNTER — Inpatient Hospital Stay (HOSPITAL_COMMUNITY): Payer: No Typology Code available for payment source

## 2022-05-08 LAB — CBC
HCT: 29.4 % — ABNORMAL LOW (ref 36.0–46.0)
Hemoglobin: 9.8 g/dL — ABNORMAL LOW (ref 12.0–15.0)
MCH: 31.4 pg (ref 26.0–34.0)
MCHC: 33.3 g/dL (ref 30.0–36.0)
MCV: 94.2 fL (ref 80.0–100.0)
Platelets: 252 10*3/uL (ref 150–400)
RBC: 3.12 MIL/uL — ABNORMAL LOW (ref 3.87–5.11)
RDW: 12.9 % (ref 11.5–15.5)
WBC: 17.8 10*3/uL — ABNORMAL HIGH (ref 4.0–10.5)
nRBC: 0 % (ref 0.0–0.2)

## 2022-05-08 LAB — URINE CULTURE: Culture: NO GROWTH

## 2022-05-08 LAB — COMPREHENSIVE METABOLIC PANEL
ALT: 131 U/L — ABNORMAL HIGH (ref 0–44)
AST: 27 U/L (ref 15–41)
Albumin: 2.7 g/dL — ABNORMAL LOW (ref 3.5–5.0)
Alkaline Phosphatase: 140 U/L — ABNORMAL HIGH (ref 38–126)
Anion gap: 8 (ref 5–15)
BUN: 7 mg/dL (ref 6–20)
CO2: 31 mmol/L (ref 22–32)
Calcium: 8.1 mg/dL — ABNORMAL LOW (ref 8.9–10.3)
Chloride: 95 mmol/L — ABNORMAL LOW (ref 98–111)
Creatinine, Ser: 0.55 mg/dL (ref 0.44–1.00)
GFR, Estimated: >60 mL/min (ref >60)
Glucose, Bld: 98 mg/dL (ref 70–99)
Potassium: 3.1 mmol/L — ABNORMAL LOW (ref 3.5–5.1)
Sodium: 134 mmol/L — ABNORMAL LOW (ref 135–145)
Total Bilirubin: 2.3 mg/dL — ABNORMAL HIGH (ref 0.3–1.2)
Total Protein: 5.9 g/dL — ABNORMAL LOW (ref 6.5–8.1)

## 2022-05-08 MED ORDER — ALBUTEROL SULFATE (2.5 MG/3ML) 0.083% IN NEBU
2.5000 mg | INHALATION_SOLUTION | Freq: Three times a day (TID) | RESPIRATORY_TRACT | Status: DC
  Administered 2022-05-08 – 2022-05-09 (×4): 2.5 mg via RESPIRATORY_TRACT
  Filled 2022-05-08 (×4): qty 3

## 2022-05-08 MED ORDER — ALBUTEROL SULFATE (2.5 MG/3ML) 0.083% IN NEBU
2.5000 mg | INHALATION_SOLUTION | Freq: Four times a day (QID) | RESPIRATORY_TRACT | Status: DC
  Administered 2022-05-08: 2.5 mg via RESPIRATORY_TRACT
  Filled 2022-05-08 (×2): qty 3

## 2022-05-08 MED ORDER — ADULT MULTIVITAMIN W/MINERALS CH
1.0000 | ORAL_TABLET | Freq: Every day | ORAL | Status: DC
  Administered 2022-05-08 – 2022-05-10 (×3): 1 via ORAL
  Filled 2022-05-08 (×3): qty 1

## 2022-05-08 MED ORDER — POTASSIUM CHLORIDE CRYS ER 20 MEQ PO TBCR
40.0000 meq | EXTENDED_RELEASE_TABLET | Freq: Once | ORAL | Status: AC
  Administered 2022-05-08: 40 meq via ORAL
  Filled 2022-05-08: qty 2

## 2022-05-08 MED ORDER — HYDROXYZINE HCL 25 MG PO TABS
25.0000 mg | ORAL_TABLET | Freq: Every day | ORAL | Status: DC
  Administered 2022-05-08 – 2022-05-09 (×2): 25 mg via ORAL
  Filled 2022-05-08 (×2): qty 1

## 2022-05-08 MED ORDER — POLYETHYLENE GLYCOL 3350 17 G PO PACK
17.0000 g | PACK | Freq: Every day | ORAL | Status: DC
  Administered 2022-05-09 – 2022-05-10 (×2): 17 g via ORAL
  Filled 2022-05-08 (×2): qty 1

## 2022-05-08 MED ORDER — POLYETHYLENE GLYCOL 3350 17 G PO PACK
17.0000 g | PACK | Freq: Every day | ORAL | Status: DC
  Administered 2022-05-08: 17 g via ORAL
  Filled 2022-05-08: qty 1

## 2022-05-08 MED ORDER — POTASSIUM CHLORIDE 10 MEQ/100ML IV SOLN
10.0000 meq | INTRAVENOUS | Status: AC
  Administered 2022-05-08 (×4): 10 meq via INTRAVENOUS
  Filled 2022-05-08 (×4): qty 100

## 2022-05-08 MED ORDER — KETOROLAC TROMETHAMINE 15 MG/ML IJ SOLN
15.0000 mg | Freq: Three times a day (TID) | INTRAMUSCULAR | Status: DC
Start: 2022-05-08 — End: 2022-05-10
  Administered 2022-05-08 – 2022-05-10 (×6): 15 mg via INTRAVENOUS
  Filled 2022-05-08 (×7): qty 1

## 2022-05-08 MED ORDER — ENSURE ENLIVE PO LIQD
237.0000 mL | Freq: Two times a day (BID) | ORAL | Status: DC
  Administered 2022-05-08 – 2022-05-10 (×3): 237 mL via ORAL

## 2022-05-08 MED ORDER — HYDROMORPHONE HCL 1 MG/ML IJ SOLN
0.5000 mg | INTRAMUSCULAR | Status: DC | PRN
  Administered 2022-05-08 – 2022-05-09 (×4): 2 mg via INTRAVENOUS
  Filled 2022-05-08 (×4): qty 2

## 2022-05-08 NOTE — Progress Notes (Signed)
Initial Nutrition Assessment  DOCUMENTATION CODES:   Not applicable  INTERVENTION:  - will d/c Boost Breeze. - will order Ensure Plus High Protein BID, each supplement provides 350 kcal and 20 grams of protein. - will order 1 tablet multivitamin with minerals/day.  Noted shellfish allergy.    NUTRITION DIAGNOSIS:   Inadequate oral intake related to acute illness as evidenced by meal completion < 50%.  GOAL:   Patient will meet greater than or equal to 90% of their needs  MONITOR:   PO intake, Supplement acceptance, Diet advancement, Labs, Weight trends  REASON FOR ASSESSMENT:   Diagnosis  ASSESSMENT:   41 year-old female with medical history of anxiety, asthma, and vitamin D deficiency. She presented to the ED on 6/2 with epigastric abdominal pain and was found to have gallstones with elevated transaminases and a Tbili of 1.5. She was taken to the OR that day for a lap chole with attempted intra-operative cholangiogram, but there was persistent leakage from the catheter so full cholangiogram was not performed. The patient opted for discharge home later that day as she was feeling well, with plans to recheck labs outpatient. Since d/c home she was having worsening abdominal pain radiating to her back and she became jaundiced so she returned to the ED. She was admitted due to elevated LFTs.  Patient sitting up in bed and her husband is at bedside. She is POD #7 lap chole and s/p ERCP with sphincterotomy on 6/5. Dx of post-ERCP pancreatitis. GI and Surgery following.   She has been NPO or on clear liquids since admission on 6/4 except for being on Soft diet from 1327 on 6/03-805 on 6/6.  Flow sheet documentation indicates patient consumed 25% of breakfast on CLD this AM.   Patient reports that both GI and Surgery staff have been by this AM and both expressed concern about pain not being adequately controlled, have encouraged PO intakes, and patient reports she was informed that  plan was to have her try full liquids at lunch.   She does not care for [facility] and shares that she was encouraged to drink chocolate supplement. Provided patient with a chocolate Ensure over ice.  Encouraged patient to take small, slow sips and bites of items with diet advancement.  Husband shares that they recently returned from a vacation where they ate very well. Patient denies any changes in intakes or activity level PTA. 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  Weight on 6/2 was documented as 140 lb. Patient denies any noted weight loss or gain recently.     Labs reviewed; Na: 134 mmol/l, K: 3.1 mmol/l, Cl: 95 mmol/l, Ca: 8.1 mg/dl, Alk Phos and ALT elevated but trending down daily.   Medications reviewed; pepto bismol PRN, 100 mg colace BID, 40 mg IV lasix x1 dose 6/8, 24000 units creon TID, 625 mg fibercon BID, 17 g miralax/day, 10 mEq IV KCl x4 runs 6/9, 40 mEq Klor-Con x1 dose 6/9.  IVF; LR @ 75 ml/hr.     NUTRITION - FOCUSED PHYSICAL EXAM:  Flowsheet Row Most Recent Value  Orbital Region No depletion  Upper Arm Region No depletion  Thoracic and Lumbar Region Unable to assess  Buccal Region No depletion  Temple Region No depletion  Clavicle Bone Region No depletion  Clavicle and Acromion Bone Region No depletion  Scapular Bone Region No depletion  Dorsal Hand No depletion  Patellar Region  No depletion  Anterior Thigh Region No depletion  Posterior Calf Region No depletion  Edema (RD Assessment) None  Hair Reviewed  Eyes Reviewed  Mouth Reviewed  Skin Reviewed  Nails Reviewed       Diet Order:   Diet Order             Diet clear liquid Room service appropriate? Yes; Fluid consistency: Thin  Diet effective now                   EDUCATION NEEDS:   Education needs have been addressed  Skin:  Skin Assessment: Reviewed RN Assessment  Last BM:  PTA/unknown  Height:   Ht  Readings from Last 1 Encounters:  05/03/22 '5\' 1"'  (1.549 m)    Weight:   Wt Readings from Last 1 Encounters:  05/03/22 63.5 kg     BMI:  Body mass index is 26.45 kg/m.  Estimated Nutritional Needs:  Kcal:  1900-2100 kcal Protein:  90-100 grams Fluid:  >/= 2 L/day     Jarome Matin, MS, RD, LDN Registered Dietitian II Inpatient Clinical Nutrition RD pager # and on-call/weekend pager # available in [facility]

## 2022-05-08 NOTE — Progress Notes (Signed)
Progress Note  4 Days Post-Op  Subjective: Pt with continued epigastric abdominal pain. Tolerating CLD, passing some occasional flatus but no BM. UOP improving and O2 requirement down some. She reports just feeling tired this AM. Has been ambulating.   Objective: Vital signs in last 24 hours: Temp:  [98.4 F (36.9 C)-99.2 F (37.3 C)] 98.4 F (36.9 C) (06/09 0918) Pulse Rate:  [113-124] 113 (06/09 0918) Resp:  [17-20] 20 (06/09 0918) BP: (103-112)/(71-77) 110/73 (06/09 0918) SpO2:  [76 %-97 %] 97 % (06/09 0918) FiO2 (%):  [28 %] 28 % (06/09 0902) Last BM Date : 05/04/22  Intake/Output from previous day: 06/08 0701 - 06/09 0700 In: 3160.8 [P.O.:480; I.V.:2480.9; IV Piggyback:199.9] Out: 1950 [Urine:1950] Intake/Output this shift: Total I/O In: 120 [P.O.:120] Out: -   PE: General: pleasant, WD, WN female who is sitting up in NAD Lungs: Respiratory effort nonlabored on supplemental oxygen Abd: soft, mild to moderate distention, BS hypoactive, ttp in epigastrium improving, incisions C/D/I Skin: jaundice improving  Psych: A&Ox3 with an appropriate affect.    Lab Results:  Recent Labs    05/07/22 0336 05/08/22 0338  WBC 19.1* 17.8*  HGB 10.5* 9.8*  HCT 32.0* 29.4*  PLT 241 252    BMET Recent Labs    05/07/22 0336 05/08/22 0338  NA 134* 134*  K 3.8 3.1*  CL 97* 95*  CO2 27 31  GLUCOSE 100* 98  BUN 8 7  CREATININE 0.57 0.55  CALCIUM 8.2* 8.1*    PT/INR No results for input(s): "LABPROT", "INR" in the last 72 hours. CMP     Component Value Date/Time   NA 134 (L) 05/08/2022 0338   K 3.1 (L) 05/08/2022 0338   CL 95 (L) 05/08/2022 0338   CO2 31 05/08/2022 0338   GLUCOSE 98 05/08/2022 0338   BUN 7 05/08/2022 0338   CREATININE 0.55 05/08/2022 0338   CREATININE 0.83 05/11/2019 0846   CALCIUM 8.1 (L) 05/08/2022 0338   PROT 5.9 (L) 05/08/2022 0338   ALBUMIN 2.7 (L) 05/08/2022 0338   AST 27 05/08/2022 0338   ALT 131 (H) 05/08/2022 0338   ALKPHOS 140  (H) 05/08/2022 0338   BILITOT 2.3 (H) 05/08/2022 0338   GFRNONAA >60 05/08/2022 0338   GFRNONAA 90 05/11/2019 0846   GFRAA 104 05/11/2019 0846   Lipase     Component Value Date/Time   LIPASE 205 (H) 05/07/2022 0336       Studies/Results: DG Abd 1 View  Result Date: 05/08/2022 CLINICAL DATA:  Severe epigastric pain. EXAM: ABDOMEN - 1 VIEW COMPARISON:  CT abdomen 05/06/2022 FINDINGS: Gaseous distension of the colon with oral contrast in the ascending colon. No bowel dilatation to suggest obstruction. No evidence of pneumoperitoneum, portal venous gas or pneumatosis. No pathologic calcifications along the expected course of the ureters. No acute osseous abnormality. IMPRESSION: 1. No evidence of bowel obstruction. Electronically Signed   By: Kathreen Devoid M.D.   On: 05/08/2022 10:16   DG CHEST PORT 1 VIEW  Result Date: 05/07/2022 CLINICAL DATA:  Hypoxia. History of asthma. EXAM: PORTABLE CHEST 1 VIEW COMPARISON:  Chest CT yesterday FINDINGS: Persistent and possibly worsened atelectasis/infiltrate in both lower lungs, possibly with small pleural effusions. Upper lungs remain clear. IMPRESSION: Persistent and possibly worsened atelectasis/infiltrate in both lower lungs, possibly with small pleural effusions. Electronically Signed   By: Nelson Chimes M.D.   On: 05/07/2022 08:35   CT ABDOMEN PELVIS W CONTRAST  Result Date: 05/06/2022 CLINICAL DATA:  Acute severe  epigastric pain.  ERCP yesterday. EXAM: CT ANGIOGRAPHY CHEST CT ABDOMEN AND PELVIS WITH CONTRAST TECHNIQUE: Multidetector CT imaging of the chest was performed using the standard protocol during bolus administration of intravenous contrast. Multiplanar CT image reconstructions and MIPs were obtained to evaluate the vascular anatomy. Multidetector CT imaging of the abdomen and pelvis was performed using the standard protocol during bolus administration of intravenous contrast. RADIATION DOSE REDUCTION: This exam was performed according to the  departmental dose-optimization program which includes automated exposure control, adjustment of the mA and/or kV according to patient size and/or use of iterative reconstruction technique. CONTRAST:  69m OMNIPAQUE IOHEXOL 350 MG/ML SOLN COMPARISON:  MRI abdomen 05/03/2022. CT abdomen and pelvis 05/03/2022. FINDINGS: CTA CHEST FINDINGS Cardiovascular: Satisfactory opacification of the pulmonary arteries to the segmental level. No evidence of pulmonary embolism. Normal heart size. No pericardial effusion. Mediastinum/Nodes: No enlarged mediastinal, hilar, or axillary lymph nodes. Thyroid gland, trachea, and esophagus demonstrate no significant findings. Lungs/Pleura: There are small bilateral pleural effusions. There is bilateral lower lobe patchy airspace disease. No evidence for pneumothorax. Trachea and central airways are patent. Musculoskeletal: Bilateral breast implants are present. No acute fractures. Review of the MIP images confirms the above findings. CT ABDOMEN and PELVIS FINDINGS Hepatobiliary: Patient is status post cholecystectomy. There is no fluid collection in the gallbladder fossa. Small amount of pneumobilia is present likely related to recent ERCP. There is no biliary ductal dilatation. Pancreas: There is mild diffuse fat stranding and fluid surrounding the pancreas. The pancreas enhances normally. There is no ductal dilatation. No enhancing fluid collections are identified. Spleen: Normal in size without focal abnormality. Adrenals/Urinary Tract: Adrenal glands are unremarkable. Kidneys are normal, without renal calculi, focal lesion, or hydronephrosis. Bladder is unremarkable. Stomach/Bowel: There is no evidence for bowel obstruction. The appendix is within normal limits. There is no focal wall thickening identified. The stomach is nondilated. There is a trace amount of free air adjacent to the appendix image 2/31. Catheter fragment is seen within nondilated small bowel in the mid abdomen image  2/66. This measures approximally 4 cm in length. Vascular/Lymphatic: No significant vascular findings are present. No enlarged abdominal or pelvic lymph nodes. Reproductive: Uterus and bilateral adnexa are unremarkable. Other: There is a small amount of free fluid in the pelvis. No focal abdominal wall hernia. There is small amount of air in the anterior abdominal wall compatible with recent surgery. There is also some mild body wall edema likely related to recent surgery. Musculoskeletal: No acute or significant osseous findings. Review of the MIP images confirms the above findings. IMPRESSION: 1. No evidence for pulmonary embolism. 2. Trace bilateral pleural effusions. 3. Patchy atelectasis and airspace disease in the bilateral lower lobes. Correlate clinically for pneumonia. 4. Findings compatible with acute pancreatitis. No enhancing fluid collections or pancreatic necrosis. 5. Trace free air adjacent to the pancreas is most likely related to recent abdominal surgery. Correlate clinically for other etiologies. 6. Status post cholecystectomy. No fluid collection in the gallbladder fossa. Pneumobilia. 7. New catheter fragment measuring 4 cm identified in mid small bowel. Please correlate clinically. No bowel obstruction. 8. Small amount of ascites. Electronically Signed   By: ARonney AstersM.D.   On: 05/06/2022 15:37   CT Angio Chest Pulmonary Embolism (PE) W or WO Contrast  Result Date: 05/06/2022 CLINICAL DATA:  Acute severe epigastric pain.  ERCP yesterday. EXAM: CT ANGIOGRAPHY CHEST CT ABDOMEN AND PELVIS WITH CONTRAST TECHNIQUE: Multidetector CT imaging of the chest was performed using the standard protocol during  bolus administration of intravenous contrast. Multiplanar CT image reconstructions and MIPs were obtained to evaluate the vascular anatomy. Multidetector CT imaging of the abdomen and pelvis was performed using the standard protocol during bolus administration of intravenous contrast. RADIATION  DOSE REDUCTION: This exam was performed according to the departmental dose-optimization program which includes automated exposure control, adjustment of the mA and/or kV according to patient size and/or use of iterative reconstruction technique. CONTRAST:  54m OMNIPAQUE IOHEXOL 350 MG/ML SOLN COMPARISON:  MRI abdomen 05/03/2022. CT abdomen and pelvis 05/03/2022. FINDINGS: CTA CHEST FINDINGS Cardiovascular: Satisfactory opacification of the pulmonary arteries to the segmental level. No evidence of pulmonary embolism. Normal heart size. No pericardial effusion. Mediastinum/Nodes: No enlarged mediastinal, hilar, or axillary lymph nodes. Thyroid gland, trachea, and esophagus demonstrate no significant findings. Lungs/Pleura: There are small bilateral pleural effusions. There is bilateral lower lobe patchy airspace disease. No evidence for pneumothorax. Trachea and central airways are patent. Musculoskeletal: Bilateral breast implants are present. No acute fractures. Review of the MIP images confirms the above findings. CT ABDOMEN and PELVIS FINDINGS Hepatobiliary: Patient is status post cholecystectomy. There is no fluid collection in the gallbladder fossa. Small amount of pneumobilia is present likely related to recent ERCP. There is no biliary ductal dilatation. Pancreas: There is mild diffuse fat stranding and fluid surrounding the pancreas. The pancreas enhances normally. There is no ductal dilatation. No enhancing fluid collections are identified. Spleen: Normal in size without focal abnormality. Adrenals/Urinary Tract: Adrenal glands are unremarkable. Kidneys are normal, without renal calculi, focal lesion, or hydronephrosis. Bladder is unremarkable. Stomach/Bowel: There is no evidence for bowel obstruction. The appendix is within normal limits. There is no focal wall thickening identified. The stomach is nondilated. There is a trace amount of free air adjacent to the appendix image 2/31. Catheter fragment is  seen within nondilated small bowel in the mid abdomen image 2/66. This measures approximally 4 cm in length. Vascular/Lymphatic: No significant vascular findings are present. No enlarged abdominal or pelvic lymph nodes. Reproductive: Uterus and bilateral adnexa are unremarkable. Other: There is a small amount of free fluid in the pelvis. No focal abdominal wall hernia. There is small amount of air in the anterior abdominal wall compatible with recent surgery. There is also some mild body wall edema likely related to recent surgery. Musculoskeletal: No acute or significant osseous findings. Review of the MIP images confirms the above findings. IMPRESSION: 1. No evidence for pulmonary embolism. 2. Trace bilateral pleural effusions. 3. Patchy atelectasis and airspace disease in the bilateral lower lobes. Correlate clinically for pneumonia. 4. Findings compatible with acute pancreatitis. No enhancing fluid collections or pancreatic necrosis. 5. Trace free air adjacent to the pancreas is most likely related to recent abdominal surgery. Correlate clinically for other etiologies. 6. Status post cholecystectomy. No fluid collection in the gallbladder fossa. Pneumobilia. 7. New catheter fragment measuring 4 cm identified in mid small bowel. Please correlate clinically. No bowel obstruction. 8. Small amount of ascites. Electronically Signed   By: ARonney AstersM.D.   On: 05/06/2022 15:37    Anti-infectives: Anti-infectives (From admission, onward)    Start     Dose/Rate Route Frequency Ordered Stop   05/06/22 0930  piperacillin-tazobactam (ZOSYN) IVPB 3.375 g        3.375 g 12.5 mL/hr over 240 Minutes Intravenous Every 8 hours 05/06/22 0846 05/10/22 0929        Assessment/Plan POD7 s/p laparoscopic cholecystectomy  Choledocholithiasis S/P ERCP with sphincterotomy 6/5 by Dr. MWatt ClimesPost-ERCP pancreatitis   -  Tbili and LFTs trending down - UOP improving, decrease IVF - tolerating CLD but KUB with some  abdominal distention, continue to mobilize and schedule miralax today  - CT 6/7 with pancreatitis - no necrosis or pseudocyst - PCA for pain control today - not ready for discharge  ?Hematuria - UA with hematuria and some trace leukocytes and rare bacteria, UCx with NGTD, resolved Hypoxia - on 2L via [place], CXR with atelectasis and small effusions, s/p lasix x1 yesterday, improving   FEN: CLD, IVF'@75'$  cc/h VTE: LMWH ID: Zosyn 6/7>>  LOS: 3 days    Norm Parcel, [facility] 05/08/2022, 10:35 AM Please see Amion for pager number during day hours 7:00am-4:30pm

## 2022-05-08 NOTE — Progress Notes (Signed)
[facility] Gastroenterology Progress Note  Felicia Cole 41 y.o. 11/24/1981  CC:  Elevated LFTs, post-ERCP pancreatitis   Subjective: Sitting in bedside chair this morning, husband at bedside.  States she continues to have epigastric pain which is better controlled with PCA pump and decreasing in severity.  Tolerating clear liquid diet, though she did not care for boost supplement.  Dietitian has seen patient this morning to address nutritional needs.  Currently on 2 L supplemental oxygen per nasal cannula.  No fever overnight.  Improvement in urine output, passing flatus but denies bowel movement.  Reports abdomen still feels distended and should be having abdominal x-ray this morning.  ROS : Review of Systems  Constitutional:  Negative for chills and fever.  Gastrointestinal:  Positive for abdominal pain and constipation. Negative for blood in stool, diarrhea, heartburn, melena, nausea and vomiting.  Genitourinary:  Negative for dysuria and flank pain.  Musculoskeletal:  Negative for falls.  Neurological:  Negative for loss of consciousness.      Objective: Vital signs in last 24 hours: Vitals:   05/08/22 0902 05/08/22 0918  BP:  110/73  Pulse:  (!) 113  Resp:  20  Temp:  98.4 F (36.9 C)  SpO2: 93% 97%    Physical Exam:  General:  Alert, cooperative, no distress, appears stated age  Head:  Normocephalic, without obvious abnormality, atraumatic  Eyes:  Anicteric sclera, EOM's intact  Lungs:   Clear to auscultation bilaterally, respirations unlabored  Heart:  Regular rate and rhythm, S1, S2 normal  Abdomen:   Soft, epigastric tenderness to palpation which is improved since yesterday, abdomen mildly distended, bowel sounds active all four quadrants  Extremities: Extremities normal, atraumatic, no  edema  Pulses: 2+ and symmetric    Lab Results: Recent Labs    05/07/22 0336 05/08/22 0338  NA 134* 134*  K 3.8 3.1*  CL 97* 95*  CO2 27 31  GLUCOSE 100* 98  BUN 8 7   CREATININE 0.57 0.55  CALCIUM 8.2* 8.1*   Recent Labs    05/07/22 0336 05/08/22 0338  AST 48* 27  ALT 186* 131*  ALKPHOS 175* 140*  BILITOT 2.5* 2.3*  PROT 5.9* 5.9*  ALBUMIN 2.7* 2.7*   Recent Labs    05/07/22 0336 05/08/22 0338  WBC 19.1* 17.8*  HGB 10.5* 9.8*  HCT 32.0* 29.4*  MCV 95.8 94.2  PLT 241 252   No results for input(s): "LABPROT", "INR" in the last 72 hours.    Assessment Elevated LFTs, post-ERCP pancreatitis - S/p lap chole - S/p ERCP 05/04/2022 - Choledocholithiasis found.  Complete removal was accomplished by biliary sphincterotomy and balloon extraction.  1 pancreatic stent placed in the ventral pancreatic duct.  Biliary tree swept and nothing found at the end of procedure. -CT abdomen/pelvis/CTA chest 05/06/2022 shows no evidence of pulmonary embolism.  Trace bilateral pleural effusions and patchy atelectasis of the bilateral lower lobes.  Evidence of acute pancreatitis without enhancing fluid collections or pancreatic necrosis.  New catheter fragment measuring 4 cm identified mid small bowel.  No bowel obstruction.  Small amount of ascites.   - Lipase improving - AST 28, ALT 131, alk phos 140, T. bili 2.3, all improving - Improving leukocytosis with WBC 17.8 - Hemoglobin 9.8 (10.5) - Tolerating clear liquid diet  Abdominal x-ray 05/08/2022 shows no evidence of bowel obstruction  Plan: Patient with post ERCP pancreatitis, LFTs/bilirubin/lipase improving.  Improving leukocytosis. Pain also improved today. Continue pain control with PCA pump. Continue clear liquid diet  with nutritional supplement and supplemental pancreatic enzymes. Continue supportive care. Continue to trend LFTs. No plan for discharge at this time. [facility] will follow.  Angelique Holm PA-C 05/08/2022, 11:18 AM  Contact #  [phone]

## 2022-05-09 DIAGNOSIS — K859 Acute pancreatitis without necrosis or infection, unspecified: Secondary | ICD-10-CM

## 2022-05-09 DIAGNOSIS — K8051 Calculus of bile duct without cholangitis or cholecystitis with obstruction: Secondary | ICD-10-CM

## 2022-05-09 LAB — COMPREHENSIVE METABOLIC PANEL
ALT: 91 U/L — ABNORMAL HIGH (ref 0–44)
AST: 17 U/L (ref 15–41)
Albumin: 2.4 g/dL — ABNORMAL LOW (ref 3.5–5.0)
Alkaline Phosphatase: 121 U/L (ref 38–126)
Anion gap: 8 (ref 5–15)
BUN: 6 mg/dL (ref 6–20)
CO2: 30 mmol/L (ref 22–32)
Calcium: 8.4 mg/dL — ABNORMAL LOW (ref 8.9–10.3)
Chloride: 101 mmol/L (ref 98–111)
Creatinine, Ser: 0.61 mg/dL (ref 0.44–1.00)
GFR, Estimated: >60 mL/min (ref >60)
Glucose, Bld: 113 mg/dL — ABNORMAL HIGH (ref 70–99)
Potassium: 3.6 mmol/L (ref 3.5–5.1)
Sodium: 139 mmol/L (ref 135–145)
Total Bilirubin: 1.4 mg/dL — ABNORMAL HIGH (ref 0.3–1.2)
Total Protein: 5.6 g/dL — ABNORMAL LOW (ref 6.5–8.1)

## 2022-05-09 LAB — CBC
HCT: 30.3 % — ABNORMAL LOW (ref 36.0–46.0)
Hemoglobin: 9.7 g/dL — ABNORMAL LOW (ref 12.0–15.0)
MCH: 30.5 pg (ref 26.0–34.0)
MCHC: 32 g/dL (ref 30.0–36.0)
MCV: 95.3 fL (ref 80.0–100.0)
Platelets: 318 10*3/uL (ref 150–400)
RBC: 3.18 MIL/uL — ABNORMAL LOW (ref 3.87–5.11)
RDW: 12.9 % (ref 11.5–15.5)
WBC: 16.6 10*3/uL — ABNORMAL HIGH (ref 4.0–10.5)
nRBC: 0 % (ref 0.0–0.2)

## 2022-05-09 MED ORDER — SODIUM CHLORIDE 0.9 % IV SOLN: INTRAVENOUS | Status: DC | PRN

## 2022-05-09 MED ORDER — OXYCODONE HCL 5 MG PO TABS
5.0000 mg | ORAL_TABLET | ORAL | Status: DC | PRN
  Administered 2022-05-09 – 2022-05-10 (×5): 10 mg via ORAL
  Filled 2022-05-09 (×5): qty 2

## 2022-05-09 NOTE — Progress Notes (Signed)
Enzo Bi 11:48 AM  Subjective: Patient seen and examined and case discussed with her husband and I do think she is in the right direction and doing a little better and is passing gas and tolerating liquids and we answered all of their questions and no new complaints  Objective: Vital signs stable afebrile no acute distress in better spirits abdomen is softer and definitely less tender white count slow decrease liver tests improved  Assessment: Pancreatitis status post ERCP  Plan: Hopefully she will tolerate diet advancement consider increasing pancreatic enzymes 2-3 times a day and might consider a fentanyl patch at home with some extra oral pain relief if needed and will plan on stopping antibiotics tomorrow and will check on tomorrow as well  [facility]  office [phone] After 5PM or if no answer call [phone]

## 2022-05-09 NOTE — Plan of Care (Signed)
  Problem: Pain Managment: Goal: General experience of comfort will improve Outcome: Progressing   Problem: Safety: Goal: Ability to remain free from injury will improve Outcome: Progressing   

## 2022-05-09 NOTE — Progress Notes (Signed)
Assessment & Plan: POD#8 - s/p laparoscopic cholecystectomy  Choledocholithiasis S/P ERCP with sphincterotomy 6/5 by Dr. Watt Climes Post-ERCP pancreatitis   - Tbili and LFTs essentially normal this AM - WBC slowly decreasing (16.6K) - saline lock IV - advance to low fat diet - begin oxycodone for pain, try to avoid dilaudid - possible discharge home tomorrow if continued progress   FEN: low fat diet, saline lock IV VTE: LMWH ID: Zosyn 6/7>>        [name], MD [facility] [place] [facility] Office: [phone]        Chief Complaint: Choledocholithiasis, post ERCP pancreatitis  Subjective: Patient up in shower, ambulating, family in room.  Objective: Vital signs in last 24 hours: Temp:  [98 F (36.7 C)-99.8 F (37.7 C)] 98 F (36.7 C) (06/10 0524) Pulse Rate:  [106-124] 106 (06/10 0524) Resp:  [18] 18 (06/10 0524) BP: (111-121)/(75-87) 121/75 (06/10 0524) SpO2:  [91 %-99 %] 99 % (06/10 0735) Last BM Date : 05/08/22 (small amt)  Intake/Output from previous day: 06/09 0701 - 06/10 0700 In: 2711.6 [P.O.:600; I.V.:1961.6; IV Piggyback:150] Out: -  Intake/Output this shift: No intake/output data recorded.  Physical Exam: HEENT - sclerae clear, mucous membranes moist Neck - soft Abdomen - protuberant, mild tenderness Neuro - alert & oriented, no focal deficits  Lab Results:  Recent Labs    05/08/22 0338 05/09/22 0336  WBC 17.8* 16.6*  HGB 9.8* 9.7*  HCT 29.4* 30.3*  PLT 252 318   BMET Recent Labs    05/08/22 0338 05/09/22 0336  NA 134* 139  K 3.1* 3.6  CL 95* 101  CO2 31 30  GLUCOSE 98 113*  BUN 7 6  CREATININE 0.55 0.61  CALCIUM 8.1* 8.4*   PT/INR No results for input(s): "LABPROT", "INR" in the last 72 hours. Comprehensive Metabolic Panel:    Component Value Date/Time   NA 139 05/09/2022 0336   NA 134 (L) 05/08/2022 0338   K 3.6 05/09/2022 0336   K 3.1 (L) 05/08/2022 0338   CL 101 05/09/2022 0336   CL 95 (L)  05/08/2022 0338   CO2 30 05/09/2022 0336   CO2 31 05/08/2022 0338   BUN 6 05/09/2022 0336   BUN 7 05/08/2022 0338   CREATININE 0.61 05/09/2022 0336   CREATININE 0.55 05/08/2022 0338   CREATININE 0.83 05/11/2019 0846   CREATININE 0.78 12/02/2016 1624   GLUCOSE 113 (H) 05/09/2022 0336   GLUCOSE 98 05/08/2022 0338   CALCIUM 8.4 (L) 05/09/2022 0336   CALCIUM 8.1 (L) 05/08/2022 0338   AST 17 05/09/2022 0336   AST 27 05/08/2022 0338   ALT 91 (H) 05/09/2022 0336   ALT 131 (H) 05/08/2022 0338   ALKPHOS 121 05/09/2022 0336   ALKPHOS 140 (H) 05/08/2022 0338   BILITOT 1.4 (H) 05/09/2022 0336   BILITOT 2.3 (H) 05/08/2022 0338   PROT 5.6 (L) 05/09/2022 0336   PROT 5.9 (L) 05/08/2022 0338   ALBUMIN 2.4 (L) 05/09/2022 0336   ALBUMIN 2.7 (L) 05/08/2022 0338    Studies/Results: DG Abd 1 View  Result Date: 05/08/2022 CLINICAL DATA:  Severe epigastric pain. EXAM: ABDOMEN - 1 VIEW COMPARISON:  CT abdomen 05/06/2022 FINDINGS: Gaseous distension of the colon with oral contrast in the ascending colon. No bowel dilatation to suggest obstruction. No evidence of pneumoperitoneum, portal venous gas or pneumatosis. No pathologic calcifications along the expected course of the ureters. No acute osseous abnormality. IMPRESSION: 1. No evidence of bowel obstruction. Electronically Signed  By: Kathreen Devoid M.D.   On: 05/08/2022 10:16      [name] 05/09/2022   Patient ID: Felicia Cole, female   DOB: [birth year], 41 y.o.   MRN: [id number]

## 2022-05-09 NOTE — Plan of Care (Signed)
  Problem: Education: Goal: Knowledge of General Education information will improve Description Including pain rating scale, medication(s)/side effects and non-pharmacologic comfort measures Outcome: Progressing   Problem: Health Behavior/Discharge Planning: Goal: Ability to manage health-related needs will improve Outcome: Progressing   

## 2022-05-10 LAB — COMPREHENSIVE METABOLIC PANEL
ALT: 68 U/L — ABNORMAL HIGH (ref 0–44)
AST: 18 U/L (ref 15–41)
Albumin: 2.4 g/dL — ABNORMAL LOW (ref 3.5–5.0)
Alkaline Phosphatase: 123 U/L (ref 38–126)
Anion gap: 8 (ref 5–15)
BUN: 6 mg/dL (ref 6–20)
CO2: 24 mmol/L (ref 22–32)
Calcium: 7.8 mg/dL — ABNORMAL LOW (ref 8.9–10.3)
Chloride: 104 mmol/L (ref 98–111)
Creatinine, Ser: 0.53 mg/dL (ref 0.44–1.00)
GFR, Estimated: >60 mL/min (ref >60)
Glucose, Bld: 102 mg/dL — ABNORMAL HIGH (ref 70–99)
Potassium: 3.3 mmol/L — ABNORMAL LOW (ref 3.5–5.1)
Sodium: 136 mmol/L (ref 135–145)
Total Bilirubin: 1.3 mg/dL — ABNORMAL HIGH (ref 0.3–1.2)
Total Protein: 5.9 g/dL — ABNORMAL LOW (ref 6.5–8.1)

## 2022-05-10 LAB — CBC WITH DIFFERENTIAL/PLATELET
Abs Immature Granulocytes: 0.24 10*3/uL — ABNORMAL HIGH (ref 0.00–0.07)
Basophils Absolute: 0.1 10*3/uL (ref 0.0–0.1)
Basophils Relative: 0 %
Eosinophils Absolute: 0.3 10*3/uL (ref 0.0–0.5)
Eosinophils Relative: 2 %
HCT: 28.5 % — ABNORMAL LOW (ref 36.0–46.0)
Hemoglobin: 9.3 g/dL — ABNORMAL LOW (ref 12.0–15.0)
Immature Granulocytes: 1 %
Lymphocytes Relative: 8 %
Lymphs Abs: 1.4 10*3/uL (ref 0.7–4.0)
MCH: 31.1 pg (ref 26.0–34.0)
MCHC: 32.6 g/dL (ref 30.0–36.0)
MCV: 95.3 fL (ref 80.0–100.0)
Monocytes Absolute: 1.3 10*3/uL — ABNORMAL HIGH (ref 0.1–1.0)
Monocytes Relative: 7 %
Neutro Abs: 13.9 10*3/uL — ABNORMAL HIGH (ref 1.7–7.7)
Neutrophils Relative %: 82 %
Platelets: 309 10*3/uL (ref 150–400)
RBC: 2.99 MIL/uL — ABNORMAL LOW (ref 3.87–5.11)
RDW: 13.2 % (ref 11.5–15.5)
WBC: 17.1 10*3/uL — ABNORMAL HIGH (ref 4.0–10.5)
nRBC: 0 % (ref 0.0–0.2)

## 2022-05-10 MED ORDER — ALBUTEROL SULFATE (2.5 MG/3ML) 0.083% IN NEBU: 2.5000 mg | INHALATION_SOLUTION | Freq: Four times a day (QID) | RESPIRATORY_TRACT | Status: DC | PRN

## 2022-05-10 MED ORDER — METHOCARBAMOL 750 MG PO TABS: 750.0000 mg | ORAL_TABLET | Freq: Three times a day (TID) | ORAL | 2 refills | Status: DC | PRN

## 2022-05-10 NOTE — Discharge Instructions (Signed)
[facility]  LAPAROSCOPIC SURGERY:  POST-OP INSTRUCTIONS  Always review your discharge instruction sheet given to you by the facility where your surgery was performed.  A prescription for pain medication may be given to you upon discharge.  Take your pain medication as prescribed.  If narcotic pain medicine is not needed, then you may take acetaminophen (Tylenol) or ibuprofen (Advil) as needed.  Take your usually prescribed medications unless otherwise directed.  If you need a refill on your pain medication, please contact your pharmacy.  They will contact our office to request authorization. Prescriptions will not be filled after 5 P.M. or on weekends.  You should follow a light diet the first few days after arrival home, such as soup and crackers or toast.  Be sure to include plenty of fluids daily.  Most patients will experience some swelling and bruising in the area of the incisions.  Ice packs will help.  Swelling and bruising can take several days to resolve.   It is common to experience some constipation after surgery.  Increasing fluid intake and taking a stool softener (such as Colace) will usually help or prevent this problem from occurring.  A mild laxative (Milk of Magnesia or Miralax) should be taken according to package instructions if there has been no bowel movement after 48 hours.  You will likely have Dermabond (topical glue) over your incisions.  This seals the incisions and allows you to bathe and shower at any time after your surgery.  Glue should remain in place for up to 10 days.  It may be removed after 10 days by pealing off the Dermabond material or using Vaseline or naval jelly to remove.  If you have steri-strips over your incisions, you may remove the gauze bandage on the second day after surgery, and you may shower at that time.  Leave your steri-strips (small skin tapes) in place directly over the incision.  These strips should remain on the  skin for 5-7 days and then be removed.  You may get them wet in the shower and pat them dry.  Any sutures or staples will be removed at the office during your follow-up visit.  ACTIVITIES:  You may resume regular (light) daily activities beginning the next day - such as daily self-care, walking, climbing stairs - gradually increasing activities as tolerated.  You may have sexual intercourse when it is comfortable.  Refrain from any heavy lifting or straining until approved by your doctor.  You may drive when you are no longer taking prescription pain medication, when you can comfortably wear a seatbelt, and when you can safely maneuver your car and apply brakes.  You should see your doctor in the office for a follow-up appointment approximately 2-3 weeks after your surgery.  Make sure that you call for this appointment within a day or two after you arrive home to insure a convenient appointment time.  WHEN TO CALL YOUR DOCTOR: Fever over 101.0 Inability to urinate Continued bleeding from incision Increased pain, redness, or drainage from the incision Increasing abdominal pain  The clinic staff is available to answer your questions during regular business hours.  Please don't hesitate to call and ask to speak to one of the nurses for clinical concerns.  If you have a medical emergency, go to the nearest emergency room or call 911.  A surgeon from [facility] is always on call for the hospital.  Usama Harkless, MD [facility] Office: [phone] Toll Free:    [phone] FAX [phone]  Website: [email or web address] 

## 2022-05-10 NOTE — Plan of Care (Signed)
  Problem: Clinical Measurements: Goal: Ability to maintain clinical measurements within normal limits will improve Outcome: Progressing   Problem: Nutrition: Goal: Adequate nutrition will be maintained Outcome: Progressing   Problem: Coping: Goal: Level of anxiety will decrease Outcome: Progressing   Problem: Elimination: Goal: Will not experience complications related to bowel motility Outcome: Progressing   Problem: Pain Managment: Goal: General experience of comfort will improve Outcome: Progressing   Problem: Safety: Goal: Ability to remain free from injury will improve Outcome: Progressing   Problem: Education: Goal: Knowledge of General Education information will improve Description: Including pain rating scale, medication(s)/side effects and non-pharmacologic comfort measures Outcome: Progressing

## 2022-05-10 NOTE — Progress Notes (Signed)
Felicia Cole 10:00 AM  Subjective: Patient seen and examined and discussed with the surgical team and she is doing much better and wants to go home and tolerating solid food and her pain is manageable and better and no nausea vomiting and passing gas and we answered all of her questions  Objective: Vital signs stable afebrile no acute distress abdomen is softer decreased tenderness all labs improved except white count stable  Assessment: Pancreatitis  Plan: She will see me Thursday or Friday and call sooner as needed and will hold antibiotics and she will be careful with her diet at home and we discussed possibly just working 1/2-day on Wednesday and Friday if she feels up to it and would continue pancreatic enzymes for now and will repeat labs in our office next week on follow-up and we discussed her PD stent passing as well  [facility]  office [phone] After 5PM or if no answer call [phone]

## 2022-05-10 NOTE — Discharge Summary (Signed)
    Physician Discharge Summary   Patient ID: Felicia Cole MRN: [id number] DOB/AGE: 04/22/1981 41 y.o.  Admit date: 05/03/2022  Discharge date: 05/10/2022  Discharge Diagnoses:  Principal Problem:   Post-ERCP acute pancreatitis Active Problems:   Acute calculous cholecystitis s/p lap cholecystectomy 05/01/2022   Elevated LFTs   Choledocholithiasis with obstruction s/p ERCP 05/04/2022   Discharged Condition: good  Hospital Course: Patient had undergone laparoscopic cholecystectomy by Dr. Gurney Maxin on May 01, 2022.  Following the procedure and discharged home the patient developed abdominal pain.  She was readmitted to the hospital due to elevated liver enzymes.  MRCP demonstrated likely small distal common bile duct stones.  Patient underwent ERCP by Dr. Clarene Essex.  Unfortunately the procedure was complicated by post ERCP pancreatitis.  Patient was managed with bowel rest, intravenous hydration, and pain control.  Patient gradually improved.  Pain control was achieved.  Diet was slowly advanced.  Liver enzymes and lipase levels normalized.  Patient was prepared for discharge home.  Consults: GI  Treatments: surgery: lap cholecystectomy 6/2 - Dr. Kieth Brightly; ERCP - Dr. Watt Climes  Discharge Exam: Blood pressure 114/82, pulse 84, temperature 98.4 F (36.9 C), temperature source Oral, resp. rate 16, height '5\' 1"'$  (1.549 m), weight 63.5 kg, SpO2 98 %. HEENT - clear Neck - soft Abd - softer, less distended, mild diffuse tenderness without guarding  Disposition: Home  Discharge Instructions     Diet - low sodium heart healthy   Complete by: As directed    Increase activity slowly   Complete by: As directed    No dressing needed   Complete by: As directed          Follow-up Information     Kinsinger, Arta Bruce, MD. Schedule an appointment as soon as possible for a visit in 2 week(s).   Specialty: General Surgery Why: For wound re-check Contact information: [place] [id number] [phone]                 Kylor Valverde, [facility] Office: [phone]   Signed: [name] 05/10/2022, 9:43 AM

## 2022-05-10 NOTE — Progress Notes (Signed)
Provided discharge education, instructions, all questions and concerns addressed. Pt not in acute distress, discharged home with belongings accompanied by family.

## 2022-05-14 ENCOUNTER — Telehealth: Payer: Self-pay

## 2022-05-14 NOTE — Telephone Encounter (Signed)
Left message for patient to call back Per [facility] schedule patient for New Patient with him.

## 2022-05-15 NOTE — Telephone Encounter (Signed)
It is ok to have the first visit video

## 2022-05-15 NOTE — Telephone Encounter (Signed)
Left message letting patient know this was ok and appointment has been changed to virtual

## 2022-05-15 NOTE — Telephone Encounter (Signed)
Spoke with patient. Scheduled for 05/20/22. Patient asked if she can do a video visit for this and then come in in September for CPE-that's when she will be due. I explained to the patient x 2 that provider prefers to see patients in person especially for new patients. Patient said Catalina Antigua has seen her as a patient at [employer]? In the past so not a true new patient to him. I tried explaining the preference and advised patient I would let provider know to make that decision and for now have her scheduled for in person.

## 2022-05-20 ENCOUNTER — Encounter: Payer: Self-pay | Admitting: Nurse Practitioner

## 2022-05-20 ENCOUNTER — Telehealth (INDEPENDENT_AMBULATORY_CARE_PROVIDER_SITE_OTHER): Payer: No Typology Code available for payment source | Admitting: Nurse Practitioner

## 2022-05-20 DIAGNOSIS — F419 Anxiety disorder, unspecified: Secondary | ICD-10-CM | POA: Diagnosis not present

## 2022-05-20 DIAGNOSIS — Z87892 Personal history of anaphylaxis: Secondary | ICD-10-CM | POA: Diagnosis not present

## 2022-05-20 DIAGNOSIS — R197 Diarrhea, unspecified: Secondary | ICD-10-CM | POA: Diagnosis not present

## 2022-05-20 DIAGNOSIS — G4700 Insomnia, unspecified: Secondary | ICD-10-CM

## 2022-05-20 MED ORDER — TRAZODONE HCL 50 MG PO TABS: 25.0000 mg | ORAL_TABLET | Freq: Every evening | ORAL | 1 refills | Status: DC | PRN

## 2022-05-20 MED ORDER — VENLAFAXINE HCL ER 150 MG PO CP24: 150.0000 mg | ORAL_CAPSULE | Freq: Every day | ORAL | 1 refills | Status: DC

## 2022-05-20 NOTE — Assessment & Plan Note (Signed)
Patient has tried melatonin, doxepin, Vistaril in the past without relief.  We will try her on 25 to 50 mg of trazodone at night after the first few days if not improving she can move up to 100 mg trazodone.  Did discuss possible serotonin syndrome being on SNRI and trazodone.

## 2022-05-20 NOTE — Assessment & Plan Note (Signed)
Patient currently has EpiPen's as needed if she has a bad allergic reaction per her report.

## 2022-05-20 NOTE — Progress Notes (Signed)
Patient ID: Felicia Cole, female    DOB: 1981-06-11, 41 y.o.   MRN: [id number]  Virtual visit completed through [place], a video enabled telemedicine application. Due to national recommendations of social distancing due to COVID-19, a virtual visit is felt to be most appropriate for this patient at this time. Reviewed limitations, risks, security and privacy concerns of performing a virtual visit and the availability of in person appointments. I also reviewed that there may be a patient responsible charge related to this service. The patient agreed to proceed.   Patient location: home Provider location: [place] at [facility], office Persons participating in this virtual visit: patient, provider   If any vitals were documented, they were collected by patient at home unless specified below.    There were no vitals taken for this visit.   CC: Establish care and insomnia Subjective:   HPI: Felicia Cole is a 41 y.o. female presenting on 05/20/2022 for Establish Care (Previous PCP Courtney with [facility] in [place]. ), Insomnia (X years. Has tried Melatonin, Dixapene, and Vestiral. This has not worked for her symptoms.), and Medication Management (Would like to discuss changing Effexor from 75 mg 1 BID to 150 1 daily-she has hard time remembering the second dose)  Anxiety: states that that she well controlled on '150mg'$  daily.  Her per current prescription is 75 mg twice daily patient requested extended release version as she has difficulty remembering taking medications in the morning.  States that she is currently in therapy, she sees her every 2 weeks. No Si/HI/AVH  Insomnia: has been dealing with it for years. States that she goes to bed around 9pm and gets up at 615. States she does not fall asleep until 11ish. Does not feel rested. Does not snore regularly.  She has tried Meatonin, Vistaril, and doxepin without relief. Has not tried trazadone, mirtazapine, or temazepam, or  Ambien.  Patient states she would like to stay away from the benzodiazepines and Ambien if possible as she does have smaller children in the house that she is to be alert for at night  Had a recent hospitilzation for cholecystectomy.  Patient presented to the emergency department on 04/30/2022 was admitted and had a cholecystectomy the next day.  Was discharged on 05/01/2022.  Patient had to return to the hospital on 05/03/2022 and be readmitted where she underwent an ERCP and pancreatic stent and developed pancreatitis.  She was discharged on 05/10/2022.  Patient states she has followed up with [facility] and was cleared.  Unable to review [facility]'s notes but do see Dr. Clarene Essex was her GI physician in the hospital     Relevant past medical, surgical, family and social history reviewed and updated as indicated. Interim medical history since our last visit reviewed. Allergies and medications reviewed and updated. Outpatient Medications Prior to Visit  Medication Sig Dispense Refill   cetirizine (ZYRTEC) 10 MG tablet Take 10 mg by mouth at bedtime.     EPINEPHrine 0.3 mg/0.3 mL IJ SOAJ injection Inject 0.3 mLs (0.3 mg total) into the muscle once. 2 Device 1   venlafaxine (EFFEXOR) 75 MG tablet Take 75 mg by mouth in the morning and at bedtime.     Famotidine (PEPCID PO) Take 1 tablet by mouth daily as needed (for reflux).     ibuprofen (ADVIL) 800 MG tablet Take 1 tablet (800 mg total) by mouth every 8 (eight) hours as needed. (Patient taking differently: Take 800 mg by mouth every  8 (eight) hours as needed (for pain).) 30 tablet 0   methocarbamol (ROBAXIN) 750 MG tablet Take 1 tablet (750 mg total) by mouth every 8 (eight) hours as needed (use for muscle cramps/pain). 30 tablet 2   oxyCODONE (OXY IR/ROXICODONE) 5 MG immediate release tablet Take 1 tablet (5 mg total) by mouth every 6 (six) hours as needed for severe pain. 10 tablet 0   venlafaxine XR (EFFEXOR-XR) 75 MG 24 hr capsule Take 1  capsule (75 mg total) by mouth daily with breakfast. (Patient not taking: Reported on 05/03/2022) 90 capsule 3   No facility-administered medications prior to visit.     Per HPI unless specifically indicated in ROS section below Review of Systems  Constitutional:  Positive for fatigue. Negative for chills and fever.  Respiratory:  Negative for shortness of breath.   Cardiovascular:  Negative for chest pain and leg swelling.  Gastrointestinal:  Positive for abdominal pain (TTP) and diarrhea (states that she has to eat small portions.).  Genitourinary:  Negative for dysuria and frequency.  Neurological:  Negative for headaches.  Psychiatric/Behavioral:  Negative for hallucinations and suicidal ideas.    Objective:  There were no vitals taken for this visit.  Wt Readings from Last 3 Encounters:  05/03/22 140 lb (63.5 kg)  05/01/22 140 lb (63.5 kg)  05/09/19 137 lb (62.1 kg)       Physical exam: Gen: alert, NAD, not ill appearing Pulm: speaks in complete sentences without increased work of breathing Psych: normal mood, normal thought content      Results for orders placed or performed during the hospital encounter of 05/03/22  Urine Culture   Specimen: Urine, Clean Catch  Result Value Ref Range   Specimen Description      URINE, CLEAN CATCH Performed at [facility], [place] [address] [place], [place] [address]    Special Requests      NONE Performed at [facility], [place] [address], [place], [place] [address]    Culture      NO GROWTH Performed at [facility], [place] [address], [place], [place] [address]    Report Status 05/07/2022 FINAL   Urine Culture   Specimen: Urine, Clean Catch  Result Value Ref Range   Specimen Description      URINE, CLEAN CATCH Performed at [facility], [place] [address], [place], [place] [address]    Special Requests      NONE Performed at [facility], [place]  [address] [place], [place] [address]    Culture      NO GROWTH Performed at [facility], [place] [address] [place], [place] [address]    Report Status 05/08/2022 FINAL   Comprehensive metabolic panel  Result Value Ref Range   Sodium 139 135 - 145 mmol/L   Potassium 3.5 3.5 - 5.1 mmol/L   Chloride 107 98 - 111 mmol/L   CO2 25 22 - 32 mmol/L   Glucose, Bld 164 (H) 70 - 99 mg/dL   BUN 8 6 - 20 mg/dL   Creatinine, Ser 0.66 0.44 - 1.00 mg/dL   Calcium 9.0 8.9 - 10.3 mg/dL   Total Protein 7.1 6.5 - 8.1 g/dL   Albumin 3.8 3.5 - 5.0 g/dL   AST 165 (H) 15 - 41 U/L   ALT 512 (H) 0 - 44 U/L   Alkaline Phosphatase 191 (H) 38 - 126 U/L   Total Bilirubin 4.2 (H) 0.3 - 1.2  mg/dL   GFR, Estimated >60 >60 mL/min   Anion gap 7 5 - 15  Lipase, blood  Result Value Ref Range   Lipase 68 (H) 11 - 51 U/L  CBC with Differential  Result Value Ref Range   WBC 7.5 4.0 - 10.5 K/uL   RBC 4.05 3.87 - 5.11 MIL/uL   Hemoglobin 12.5 12.0 - 15.0 g/dL   HCT 38.1 36.0 - 46.0 %   MCV 94.1 80.0 - 100.0 fL   MCH 30.9 26.0 - 34.0 pg   MCHC 32.8 30.0 - 36.0 g/dL   RDW 13.2 11.5 - 15.5 %   Platelets 224 150 - 400 K/uL   nRBC 0.0 0.0 - 0.2 %   Neutrophils Relative % 78 %   Neutro Abs 5.8 1.7 - 7.7 K/uL   Lymphocytes Relative 12 %   Lymphs Abs 0.9 0.7 - 4.0 K/uL   Monocytes Relative 9 %   Monocytes Absolute 0.7 0.1 - 1.0 K/uL   Eosinophils Relative 1 %   Eosinophils Absolute 0.1 0.0 - 0.5 K/uL   Basophils Relative 0 %   Basophils Absolute 0.0 0.0 - 0.1 K/uL   Immature Granulocytes 0 %   Abs Immature Granulocytes 0.02 0.00 - 0.07 K/uL  Comprehensive metabolic panel  Result Value Ref Range   Sodium 137 135 - 145 mmol/L   Potassium 3.5 3.5 - 5.1 mmol/L   Chloride 107 98 - 111 mmol/L   CO2 24 22 - 32 mmol/L   Glucose, Bld 105 (H) 70 - 99 mg/dL   BUN 6 6 - 20 mg/dL   Creatinine, Ser 0.52 0.44 - 1.00 mg/dL   Calcium 8.8 (L) 8.9 - 10.3 mg/dL   Total Protein 6.8 6.5 - 8.1 g/dL   Albumin 3.6 3.5 - 5.0  g/dL   AST 125 (H) 15 - 41 U/L   ALT 416 (H) 0 - 44 U/L   Alkaline Phosphatase 170 (H) 38 - 126 U/L   Total Bilirubin 4.1 (H) 0.3 - 1.2 mg/dL   GFR, Estimated >60 >60 mL/min   Anion gap 6 5 - 15  Glucose, capillary  Result Value Ref Range   Glucose-Capillary 106 (H) 70 - 99 mg/dL  Comprehensive metabolic panel  Result Value Ref Range   Sodium 136 135 - 145 mmol/L   Potassium 3.4 (L) 3.5 - 5.1 mmol/L   Chloride 105 98 - 111 mmol/L   CO2 23 22 - 32 mmol/L   Glucose, Bld 133 (H) 70 - 99 mg/dL   BUN 6 6 - 20 mg/dL   Creatinine, Ser 0.71 0.44 - 1.00 mg/dL   Calcium 8.7 (L) 8.9 - 10.3 mg/dL   Total Protein 7.1 6.5 - 8.1 g/dL   Albumin 3.7 3.5 - 5.0 g/dL   AST 128 (H) 15 - 41 U/L   ALT 374 (H) 0 - 44 U/L   Alkaline Phosphatase 244 (H) 38 - 126 U/L   Total Bilirubin 2.6 (H) 0.3 - 1.2 mg/dL   GFR, Estimated >60 >60 mL/min   Anion gap 8 5 - 15  CBC with Differential/Platelet  Result Value Ref Range   WBC 10.3 4.0 - 10.5 K/uL   RBC 4.05 3.87 - 5.11 MIL/uL   Hemoglobin 12.5 12.0 - 15.0 g/dL   HCT 37.8 36.0 - 46.0 %   MCV 93.3 80.0 - 100.0 fL   MCH 30.9 26.0 - 34.0 pg   MCHC 33.1 30.0 - 36.0 g/dL   RDW 12.9 11.5 - 15.5 %  Platelets 255 150 - 400 K/uL   nRBC 0.0 0.0 - 0.2 %   Neutrophils Relative % 70 %   Neutro Abs 7.3 1.7 - 7.7 K/uL   Lymphocytes Relative 19 %   Lymphs Abs 2.0 0.7 - 4.0 K/uL   Monocytes Relative 9 %   Monocytes Absolute 0.9 0.1 - 1.0 K/uL   Eosinophils Relative 1 %   Eosinophils Absolute 0.1 0.0 - 0.5 K/uL   Basophils Relative 0 %   Basophils Absolute 0.0 0.0 - 0.1 K/uL   Immature Granulocytes 1 %   Abs Immature Granulocytes 0.05 0.00 - 0.07 K/uL  Lipase, blood  Result Value Ref Range   Lipase 957 (H) 11 - 51 U/L  Urinalysis, Routine w reflex microscopic Urine, Clean Catch  Result Value Ref Range   Color, Urine AMBER (A) YELLOW   APPearance HAZY (A) CLEAR   Specific Gravity, Urine 1.021 1.005 - 1.030   pH 5.0 5.0 - 8.0   Glucose, UA NEGATIVE NEGATIVE  mg/dL   Hgb urine dipstick NEGATIVE NEGATIVE   Bilirubin Urine NEGATIVE NEGATIVE   Ketones, ur NEGATIVE NEGATIVE mg/dL   Protein, ur 30 (A) NEGATIVE mg/dL   Nitrite NEGATIVE NEGATIVE   Leukocytes,Ua TRACE (A) NEGATIVE   RBC / HPF 0-5 0 - 5 RBC/hpf   WBC, UA 6-10 0 - 5 WBC/hpf   Bacteria, UA RARE (A) NONE SEEN   Squamous Epithelial / LPF 21-50 0 - 5   Mucus PRESENT    Hyaline Casts, UA PRESENT   Lipase, blood  Result Value Ref Range   Lipase 1,123 (H) 11 - 51 U/L  Comprehensive metabolic panel  Result Value Ref Range   Sodium 137 135 - 145 mmol/L   Potassium 3.9 3.5 - 5.1 mmol/L   Chloride 105 98 - 111 mmol/L   CO2 24 22 - 32 mmol/L   Glucose, Bld 94 70 - 99 mg/dL   BUN 10 6 - 20 mg/dL   Creatinine, Ser 0.67 0.44 - 1.00 mg/dL   Calcium 8.7 (L) 8.9 - 10.3 mg/dL   Total Protein 6.4 (L) 6.5 - 8.1 g/dL   Albumin 3.2 (L) 3.5 - 5.0 g/dL   AST 102 (H) 15 - 41 U/L   ALT 291 (H) 0 - 44 U/L   Alkaline Phosphatase 229 (H) 38 - 126 U/L   Total Bilirubin 3.3 (H) 0.3 - 1.2 mg/dL   GFR, Estimated >60 >60 mL/min   Anion gap 8 5 - 15  CBC  Result Value Ref Range   WBC 16.5 (H) 4.0 - 10.5 K/uL   RBC 3.69 (L) 3.87 - 5.11 MIL/uL   Hemoglobin 11.6 (L) 12.0 - 15.0 g/dL   HCT 35.8 (L) 36.0 - 46.0 %   MCV 97.0 80.0 - 100.0 fL   MCH 31.4 26.0 - 34.0 pg   MCHC 32.4 30.0 - 36.0 g/dL   RDW 13.2 11.5 - 15.5 %   Platelets 243 150 - 400 K/uL   nRBC 0.0 0.0 - 0.2 %  Comprehensive metabolic panel  Result Value Ref Range   Sodium 134 (L) 135 - 145 mmol/L   Potassium 3.8 3.5 - 5.1 mmol/L   Chloride 97 (L) 98 - 111 mmol/L   CO2 27 22 - 32 mmol/L   Glucose, Bld 100 (H) 70 - 99 mg/dL   BUN 8 6 - 20 mg/dL   Creatinine, Ser 0.57 0.44 - 1.00 mg/dL   Calcium 8.2 (L) 8.9 - 10.3 mg/dL   Total  Protein 5.9 (L) 6.5 - 8.1 g/dL   Albumin 2.7 (L) 3.5 - 5.0 g/dL   AST 48 (H) 15 - 41 U/L   ALT 186 (H) 0 - 44 U/L   Alkaline Phosphatase 175 (H) 38 - 126 U/L   Total Bilirubin 2.5 (H) 0.3 - 1.2 mg/dL   GFR,  Estimated >60 >60 mL/min   Anion gap 10 5 - 15  CBC  Result Value Ref Range   WBC 19.1 (H) 4.0 - 10.5 K/uL   RBC 3.34 (L) 3.87 - 5.11 MIL/uL   Hemoglobin 10.5 (L) 12.0 - 15.0 g/dL   HCT 32.0 (L) 36.0 - 46.0 %   MCV 95.8 80.0 - 100.0 fL   MCH 31.4 26.0 - 34.0 pg   MCHC 32.8 30.0 - 36.0 g/dL   RDW 13.2 11.5 - 15.5 %   Platelets 241 150 - 400 K/uL   nRBC 0.0 0.0 - 0.2 %  Lipase, blood  Result Value Ref Range   Lipase 205 (H) 11 - 51 U/L  Comprehensive metabolic panel  Result Value Ref Range   Sodium 134 (L) 135 - 145 mmol/L   Potassium 3.1 (L) 3.5 - 5.1 mmol/L   Chloride 95 (L) 98 - 111 mmol/L   CO2 31 22 - 32 mmol/L   Glucose, Bld 98 70 - 99 mg/dL   BUN 7 6 - 20 mg/dL   Creatinine, Ser 0.55 0.44 - 1.00 mg/dL   Calcium 8.1 (L) 8.9 - 10.3 mg/dL   Total Protein 5.9 (L) 6.5 - 8.1 g/dL   Albumin 2.7 (L) 3.5 - 5.0 g/dL   AST 27 15 - 41 U/L   ALT 131 (H) 0 - 44 U/L   Alkaline Phosphatase 140 (H) 38 - 126 U/L   Total Bilirubin 2.3 (H) 0.3 - 1.2 mg/dL   GFR, Estimated >60 >60 mL/min   Anion gap 8 5 - 15  CBC  Result Value Ref Range   WBC 17.8 (H) 4.0 - 10.5 K/uL   RBC 3.12 (L) 3.87 - 5.11 MIL/uL   Hemoglobin 9.8 (L) 12.0 - 15.0 g/dL   HCT 29.4 (L) 36.0 - 46.0 %   MCV 94.2 80.0 - 100.0 fL   MCH 31.4 26.0 - 34.0 pg   MCHC 33.3 30.0 - 36.0 g/dL   RDW 12.9 11.5 - 15.5 %   Platelets 252 150 - 400 K/uL   nRBC 0.0 0.0 - 0.2 %  Comprehensive metabolic panel  Result Value Ref Range   Sodium 139 135 - 145 mmol/L   Potassium 3.6 3.5 - 5.1 mmol/L   Chloride 101 98 - 111 mmol/L   CO2 30 22 - 32 mmol/L   Glucose, Bld 113 (H) 70 - 99 mg/dL   BUN 6 6 - 20 mg/dL   Creatinine, Ser 0.61 0.44 - 1.00 mg/dL   Calcium 8.4 (L) 8.9 - 10.3 mg/dL   Total Protein 5.6 (L) 6.5 - 8.1 g/dL   Albumin 2.4 (L) 3.5 - 5.0 g/dL   AST 17 15 - 41 U/L   ALT 91 (H) 0 - 44 U/L   Alkaline Phosphatase 121 38 - 126 U/L   Total Bilirubin 1.4 (H) 0.3 - 1.2 mg/dL   GFR, Estimated >60 >60 mL/min   Anion gap 8 5 -  15  CBC  Result Value Ref Range   WBC 16.6 (H) 4.0 - 10.5 K/uL   RBC 3.18 (L) 3.87 - 5.11 MIL/uL   Hemoglobin 9.7 (L) 12.0 - 15.0  g/dL   HCT 30.3 (L) 36.0 - 46.0 %   MCV 95.3 80.0 - 100.0 fL   MCH 30.5 26.0 - 34.0 pg   MCHC 32.0 30.0 - 36.0 g/dL   RDW 12.9 11.5 - 15.5 %   Platelets 318 150 - 400 K/uL   nRBC 0.0 0.0 - 0.2 %  CBC with Differential/Platelet  Result Value Ref Range   WBC 17.1 (H) 4.0 - 10.5 K/uL   RBC 2.99 (L) 3.87 - 5.11 MIL/uL   Hemoglobin 9.3 (L) 12.0 - 15.0 g/dL   HCT 28.5 (L) 36.0 - 46.0 %   MCV 95.3 80.0 - 100.0 fL   MCH 31.1 26.0 - 34.0 pg   MCHC 32.6 30.0 - 36.0 g/dL   RDW 13.2 11.5 - 15.5 %   Platelets 309 150 - 400 K/uL   nRBC 0.0 0.0 - 0.2 %   Neutrophils Relative % 82 %   Neutro Abs 13.9 (H) 1.7 - 7.7 K/uL   Lymphocytes Relative 8 %   Lymphs Abs 1.4 0.7 - 4.0 K/uL   Monocytes Relative 7 %   Monocytes Absolute 1.3 (H) 0.1 - 1.0 K/uL   Eosinophils Relative 2 %   Eosinophils Absolute 0.3 0.0 - 0.5 K/uL   Basophils Relative 0 %   Basophils Absolute 0.1 0.0 - 0.1 K/uL   Immature Granulocytes 1 %   Abs Immature Granulocytes 0.24 (H) 0.00 - 0.07 K/uL  Comprehensive metabolic panel  Result Value Ref Range   Sodium 136 135 - 145 mmol/L   Potassium 3.3 (L) 3.5 - 5.1 mmol/L   Chloride 104 98 - 111 mmol/L   CO2 24 22 - 32 mmol/L   Glucose, Bld 102 (H) 70 - 99 mg/dL   BUN 6 6 - 20 mg/dL   Creatinine, Ser 0.53 0.44 - 1.00 mg/dL   Calcium 7.8 (L) 8.9 - 10.3 mg/dL   Total Protein 5.9 (L) 6.5 - 8.1 g/dL   Albumin 2.4 (L) 3.5 - 5.0 g/dL   AST 18 15 - 41 U/L   ALT 68 (H) 0 - 44 U/L   Alkaline Phosphatase 123 38 - 126 U/L   Total Bilirubin 1.3 (H) 0.3 - 1.2 mg/dL   GFR, Estimated >60 >60 mL/min   Anion gap 8 5 - 15  I-Stat beta hCG blood, ED  Result Value Ref Range   I-stat hCG, quantitative <5.0 <5 mIU/mL   Comment 3           Assessment & Plan:   Problem List Items Addressed This Visit       Other   Anxiety - Primary    Patient was using  Effexor 75 mg twice daily but had difficulty remembering to the pill dosing today.  We will switch patient to venlafaxine 150 mg XR.  Patient made aware of the change      Relevant Medications   venlafaxine XR (EFFEXOR XR) 150 MG 24 hr capsule   traZODone (DESYREL) 50 MG tablet   History of anaphylaxis    Patient currently has EpiPen's as needed if she has a bad allergic reaction per her report.      Diarrhea    Has been fairly persistent since having cholecystectomy.  Patient states she had a small improvement postprocedure she has been doing small meals and staying close to the restroom as needed.  She is already followed up with gastroenterology and was cleared      Insomnia    Patient has tried melatonin,  doxepin, Vistaril in the past without relief.  We will try her on 25 to 50 mg of trazodone at night after the first few days if not improving she can move up to 100 mg trazodone.  Did discuss possible serotonin syndrome being on SNRI and trazodone.      Relevant Medications   traZODone (DESYREL) 50 MG tablet     Meds ordered this encounter  Medications   venlafaxine XR (EFFEXOR XR) 150 MG 24 hr capsule    Sig: Take 1 capsule (150 mg total) by mouth daily with breakfast.    Dispense:  90 capsule    Refill:  1    Order Specific Question:   Supervising Provider    Answer:   Glori Bickers, MARNE A [[id number]]   traZODone (DESYREL) 50 MG tablet    Sig: Take 0.5-1 tablets (25-50 mg total) by mouth at bedtime as needed for sleep.    Dispense:  30 tablet    Refill:  1    Order Specific Question:   Supervising Provider    Answer:   TOWER, MARNE A [[id number]]   No orders of the defined types were placed in this encounter.   I discussed the assessment and treatment plan with the patient. The patient was provided an opportunity to ask questions and all were answered. The patient agreed with the plan and demonstrated an understanding of the instructions. The patient was advised to call back or seek an  in-person evaluation if the symptoms worsen or if the condition fails to improve as anticipated.  Follow up plan: No follow-ups on file.  Romilda Garret, NP

## 2022-05-20 NOTE — Assessment & Plan Note (Signed)
Patient was using Effexor 75 mg twice daily but had difficulty remembering to the pill dosing today.  We will switch patient to venlafaxine 150 mg XR.  Patient made aware of the change

## 2022-05-20 NOTE — Assessment & Plan Note (Signed)
Has been fairly persistent since having cholecystectomy.  Patient states she had a small improvement postprocedure she has been doing small meals and staying close to the restroom as needed.  She is already followed up with gastroenterology and was cleared

## 2022-08-13 ENCOUNTER — Other Ambulatory Visit: Payer: Self-pay | Admitting: Nurse Practitioner

## 2022-08-13 DIAGNOSIS — G4700 Insomnia, unspecified: Secondary | ICD-10-CM

## 2022-08-13 MED ORDER — TRAZODONE HCL 50 MG PO TABS: 25.0000 mg | ORAL_TABLET | Freq: Every evening | ORAL | 1 refills | Status: DC | PRN

## 2022-09-09 ENCOUNTER — Encounter: Payer: Self-pay | Admitting: Nurse Practitioner

## 2022-09-09 ENCOUNTER — Ambulatory Visit (INDEPENDENT_AMBULATORY_CARE_PROVIDER_SITE_OTHER): Payer: No Typology Code available for payment source | Admitting: Nurse Practitioner

## 2022-09-09 VITALS — BP 100/66 | HR 90 | Temp 96.8°F | Resp 12 | Ht 61.0 in | Wt 137.4 lb

## 2022-09-09 DIAGNOSIS — F419 Anxiety disorder, unspecified: Secondary | ICD-10-CM

## 2022-09-09 DIAGNOSIS — E559 Vitamin D deficiency, unspecified: Secondary | ICD-10-CM | POA: Diagnosis not present

## 2022-09-09 DIAGNOSIS — G4700 Insomnia, unspecified: Secondary | ICD-10-CM | POA: Diagnosis not present

## 2022-09-09 DIAGNOSIS — Z0000 Encounter for general adult medical examination without abnormal findings: Secondary | ICD-10-CM

## 2022-09-09 DIAGNOSIS — J4520 Mild intermittent asthma, uncomplicated: Secondary | ICD-10-CM

## 2022-09-09 LAB — LIPID PANEL
Cholesterol: 183 mg/dL (ref 0–200)
HDL: 42.3 mg/dL (ref >39.00)
LDL Cholesterol: 111 mg/dL — ABNORMAL HIGH (ref 0–99)
NonHDL: 140.95
Total CHOL/HDL Ratio: 4
Triglycerides: 151 mg/dL — ABNORMAL HIGH (ref 0.0–149.0)
VLDL: 30.2 mg/dL (ref 0.0–40.0)

## 2022-09-09 LAB — COMPREHENSIVE METABOLIC PANEL
ALT: 17 U/L (ref 0–35)
AST: 17 U/L (ref 0–37)
Albumin: 4.4 g/dL (ref 3.5–5.2)
Alkaline Phosphatase: 79 U/L (ref 39–117)
BUN: 11 mg/dL (ref 6–23)
CO2: 30 mEq/L (ref 19–32)
Calcium: 9.2 mg/dL (ref 8.4–10.5)
Chloride: 102 mEq/L (ref 96–112)
Creatinine, Ser: 0.76 mg/dL (ref 0.40–1.20)
GFR: 97.43 mL/min (ref >60.00)
Glucose, Bld: 87 mg/dL (ref 70–99)
Potassium: 4.2 mEq/L (ref 3.5–5.1)
Sodium: 139 mEq/L (ref 135–145)
Total Bilirubin: 0.7 mg/dL (ref 0.2–1.2)
Total Protein: 7.3 g/dL (ref 6.0–8.3)

## 2022-09-09 LAB — VITAMIN D 25 HYDROXY (VIT D DEFICIENCY, FRACTURES): VITD: 28.56 ng/mL — ABNORMAL LOW (ref 30.00–100.00)

## 2022-09-09 LAB — CBC
HCT: 38.5 % (ref 36.0–46.0)
Hemoglobin: 13.4 g/dL (ref 12.0–15.0)
MCHC: 34.8 g/dL (ref 30.0–36.0)
MCV: 87.7 fl (ref 78.0–100.0)
Platelets: 264 10*3/uL (ref 150.0–400.0)
RBC: 4.39 Mil/uL (ref 3.87–5.11)
RDW: 13.7 % (ref 11.5–15.5)
WBC: 6.5 10*3/uL (ref 4.0–10.5)

## 2022-09-09 LAB — TSH: TSH: 1.02 u[IU]/mL (ref 0.35–5.50)

## 2022-09-09 NOTE — Assessment & Plan Note (Signed)
Patient currently maintained on venlafaxine 150 mg daily.  Was on venlafaxine 75 mg.  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

## 2022-09-09 NOTE — Assessment & Plan Note (Signed)
Discussed age-appropriate musicians.  Patient was giving preventative healthcare maintenance information at dismissal in clinic.

## 2022-09-09 NOTE — Assessment & Plan Note (Signed)
Pending lab result.

## 2022-09-09 NOTE — Patient Instructions (Signed)
Nice to see you today I will be in touch with the labs once I have the results Follow up in 1 year for your next physical, sooner if you need me  

## 2022-09-09 NOTE — Assessment & Plan Note (Signed)
Patient taking 50 mg of trazodone nightly as needed.  Tolerating medication well.  Seems to be effective.  Continue medication as prescribed

## 2022-09-09 NOTE — Assessment & Plan Note (Signed)
Continue taking second-generation antihistamine.

## 2022-09-09 NOTE — Progress Notes (Signed)
Established Patient Office Visit  Subjective   Patient ID: Felicia Cole, female    DOB: 1981/10/10  Age: 41 y.o. MRN: [id number]  Chief Complaint  Patient presents with   Annual Exam    Has gyn    HPI  Asthma: Patient currently maintained on Zyrtec.  Does not have an albuterol inhaler  Insomnia: Patient uses trazodone prn twice a week. Sleeps well and gets 7-8 hours. Wihtout medication she gets approximately 5-6 hours of sleep.  She endorses taking a whole tablet of trazodone at a time.  Anxiety: States that she is doing well on her current dosage of venlafaxine.  States she is wondering if she can go down back to 75 mg.  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  Patient denies HI/SI/AVH  for complete physical and follow up of chronic conditions.  Immunizations: -Tetanus:2016 -Influenza:08/31/2022 -Shingles: Too young -Pneumonia: Too young - Covid: pfizer  -HPV: patients up to date   Diet: [place]. 2 meals with at home and eating out. Snacks. Coffee, water and diet Dr. Malachi Bonds. (Largest fluid intake) Exercise: No regular exercise. States that she joined a gym a month ago. Twice a week cardio 30 mins a day  Eye exam: PRN.  Patient has history of [place] eye surgery. Dental exam: Completes semi-annually   Pap Smear: Completed at [facility] in [place]. Last pap 2 years ago  Mammogram: Completed in  09/25/2021 Imaging center near [facility].  Colonoscopy:  maternal aunt in early 40s was diagnosed with colon cancer. Mother did cancer genetics that was negative. Patient declined colonoscopy at this time wants to stick with screening age currently. Lung Cancer Screening: N/A Dexa: N/A      Review of Systems  Constitutional:  Negative for chills and fever.  HENT:  Negative for ear pain, sinus pain and sore throat.   Respiratory:  Positive for cough, sputum production  and wheezing. Negative for shortness of breath.   Cardiovascular:  Negative for chest pain.  Gastrointestinal:  Negative for abdominal pain, blood in stool, diarrhea, nausea and vomiting.       BM most day   Genitourinary:  Negative for dysuria and hematuria.  Neurological:  Negative for headaches.  Psychiatric/Behavioral:  Negative for hallucinations and suicidal ideas.       Objective:     BP 100/66   Pulse 90   Temp (!) 96.8 F (36 C)   Resp 12   Ht '5\' 1"'$  (1.549 m)   Wt 137 lb 6 oz (62.3 kg)   SpO2 97%   BMI 25.96 kg/m  BP Readings from Last 3 Encounters:  09/09/22 100/66  05/10/22 114/82  05/01/22 95/64   Wt Readings from Last 3 Encounters:  09/09/22 137 lb 6 oz (62.3 kg)  05/03/22 140 lb (63.5 kg)  05/01/22 140 lb (63.5 kg)      Physical Exam Vitals and nursing note reviewed.  Constitutional:      Appearance: Normal appearance.  HENT:     Right Ear: Tympanic membrane, ear canal and external ear normal.     Left Ear: Tympanic membrane, ear canal and external ear normal.     Mouth/Throat:     Mouth: Mucous membranes are moist.     Pharynx: Oropharynx is clear.  Eyes:     Extraocular Movements: Extraocular movements intact.     Pupils: Pupils are equal, round,  and reactive to light.  Cardiovascular:     Rate and Rhythm: Normal rate and regular rhythm.     Heart sounds: Normal heart sounds.  Pulmonary:     Effort: Pulmonary effort is normal.     Breath sounds: Normal breath sounds.  Abdominal:     General: Bowel sounds are normal. There is no distension.     Palpations: There is no mass.     Tenderness: There is no abdominal tenderness.     Hernia: No hernia is present.  Musculoskeletal:     Right lower leg: No edema.     Left lower leg: No edema.  Lymphadenopathy:     Cervical: No cervical adenopathy.  Skin:    General: Skin is warm.  Neurological:     General: No focal deficit present.     Mental Status: She is alert.     Deep Tendon Reflexes:      Reflex Scores:      Bicep reflexes are 2+ on the right side and 2+ on the left side.      Patellar reflexes are 2+ on the right side and 2+ on the left side.    Comments: Bilateral upper and lower extremity strength 5/5  Psychiatric:        Mood and Affect: Mood normal.        Behavior: Behavior normal.        Thought Content: Thought content normal.        Judgment: Judgment normal.      No results found for any visits on 09/09/22.    The ASCVD Risk score (Arnett DK, et al., 2019) failed to calculate for the following reasons:   Cannot find a previous HDL lab    Assessment & Plan:   Problem List Items Addressed This Visit       Respiratory   Asthma    Continue taking second-generation antihistamine.        Other   Anxiety - Primary    Patient currently maintained on venlafaxine 150 mg daily.  Was on venlafaxine 75 mg.  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      Relevant Orders   TSH   Vitamin D deficiency    Pending lab result.      Relevant Orders   VITAMIN D 25 Hydroxy (Vit-D Deficiency, Fractures)   Insomnia    Patient taking 50 mg of trazodone nightly as needed.  Tolerating medication well.  Seems to be effective.  Continue medication as prescribed      Relevant Orders   TSH   Preventative health care    Discussed age-appropriate musicians.  Patient was giving preventative healthcare maintenance information at dismissal in clinic.      Relevant Orders   CBC   Comprehensive metabolic panel   Lipid panel   TSH    Return in about 1 year (around 09/10/2023) for CPE and labs.    Romilda Garret, NP

## 2022-09-17 ENCOUNTER — Other Ambulatory Visit: Payer: Self-pay | Admitting: Nurse Practitioner

## 2022-09-17 DIAGNOSIS — F419 Anxiety disorder, unspecified: Secondary | ICD-10-CM

## 2022-09-17 MED ORDER — VENLAFAXINE HCL ER 75 MG PO CP24: 75.0000 mg | ORAL_CAPSULE | Freq: Every day | ORAL | 3 refills | Status: DC

## 2023-07-06 ENCOUNTER — Other Ambulatory Visit: Payer: Self-pay | Admitting: Nurse Practitioner

## 2023-07-06 DIAGNOSIS — R41840 Attention and concentration deficit: Secondary | ICD-10-CM

## 2023-07-28 ENCOUNTER — Other Ambulatory Visit: Payer: Self-pay | Admitting: Nurse Practitioner

## 2023-07-28 DIAGNOSIS — G4700 Insomnia, unspecified: Secondary | ICD-10-CM

## 2023-07-28 MED ORDER — TRAZODONE HCL 50 MG PO TABS: 25.0000 mg | ORAL_TABLET | Freq: Every evening | ORAL | 2 refills | Status: AC | PRN

## 2023-08-06 ENCOUNTER — Telehealth: Payer: No Typology Code available for payment source | Admitting: Nurse Practitioner

## 2023-08-06 VITALS — HR 100 | Ht 61.0 in | Wt 131.0 lb

## 2023-08-06 DIAGNOSIS — R41840 Attention and concentration deficit: Secondary | ICD-10-CM

## 2023-08-06 MED ORDER — BUPROPION HCL ER (XL) 150 MG PO TB24: 150.0000 mg | ORAL_TABLET | Freq: Every day | ORAL | 0 refills | Status: DC

## 2023-08-06 NOTE — Patient Instructions (Signed)
Nice to see you today. Call psychiatric group to make sure Wellbutrin will not affect validity of ADD/ADHD testing.

## 2023-08-06 NOTE — Progress Notes (Signed)
Ph: [phone] Fax: [phone]   Patient ID: Felicia Cole, female    DOB: 1981-05-16, 42 y.o.   MRN: [id number]  Virtual visit completed through MyChart, a video enabled telemedicine application. Due to national recommendations of social distancing due to COVID-19, a virtual visit is felt to be most appropriate for this patient at this time. Reviewed limitations, risks, security and privacy concerns of performing a virtual visit and the availability of in person appointments. I also reviewed that there may be a patient responsible charge related to this service. The patient agreed to proceed.   Patient location: home Provider location: [facility], office Persons participating in this virtual visit: patient, provider   If any vitals were documented, they were collected by patient at home unless specified below.    Pulse 100 Comment: per patient  Ht 5\' 1"  (1.549 m) Comment: per chart  Wt 131 lb (59.4 kg) Comment: per patient  SpO2 99% Comment: per patient  BMI 24.75 kg/m    CC: Concentration issues Subjective:   HPI: Felicia Cole is a 42 y.o. female presenting on 08/06/2023 for Medication Management (Pt wants to discuss ADHD medication. 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 States that she is gong to be tested on 08/19/2023 with [facility].   States that she has tried vyvanse and had an elevated heart rate in the 120s and was taken off the medication  Patient is talking to her therapist and her therapist does think that she should be tested. She has anxiety and has been on anxiolytics since college. Patient states several mistakes have been brought to her attention at her work.  Patient thinks this is contributing factor with concentration and focus issue.   Relevant past medical, surgical, family and social history reviewed and updated as  indicated. Interim medical history since our last visit reviewed. Allergies and medications reviewed and updated. Outpatient Medications Prior to Visit  Medication Sig Dispense Refill   cetirizine (ZYRTEC) 10 MG tablet Take 10 mg by mouth at bedtime.     EPINEPHrine 0.3 mg/0.3 mL IJ SOAJ injection Inject 0.3 mLs (0.3 mg total) into the muscle once. 2 Device 1   traZODone (DESYREL) 50 MG tablet Take 0.5-1 tablets (25-50 mg total) by mouth at bedtime as needed for sleep. 30 tablet 2   venlafaxine XR (EFFEXOR XR) 75 MG 24 hr capsule Take 1 capsule (75 mg total) by mouth daily with breakfast. 90 capsule 3   No facility-administered medications prior to visit.     Per HPI unless specifically indicated in ROS section below Review of Systems  Respiratory:  Negative for shortness of breath.   Cardiovascular:  Negative for chest pain.  Neurological:  Negative for headaches.  Psychiatric/Behavioral:  Positive for decreased concentration and sleep disturbance. The patient is not nervous/anxious.    Objective:  Pulse 100 Comment: per patient  Ht 5\' 1"  (1.549 m) Comment: per chart  Wt 131 lb (59.4 kg) Comment: per patient  SpO2 99% Comment: per patient  BMI 24.75 kg/m   Wt Readings from Last 3 Encounters:  08/06/23 131 lb (59.4 kg)  09/09/22 137 lb 6 oz (62.3 kg)  05/03/22 140 lb (63.5 kg)       Physical exam: Gen: alert, NAD, not ill appearing Pulm: speaks in complete sentences without increased work of breathing Psych: normal mood, normal thought content  Results for orders placed or performed in visit on 09/09/22  CBC  Result Value Ref Range   WBC 6.5 4.0 - 10.5 K/uL   RBC 4.39 3.87 - 5.11 Mil/uL   Platelets 264.0 150.0 - 400.0 K/uL   Hemoglobin 13.4 12.0 - 15.0 g/dL   HCT 28.4 13.2 - 44.0 %   MCV 87.7 78.0 - 100.0 fl   MCHC 34.8 30.0 - 36.0 g/dL   RDW 10.2 72.5 - 36.6 %  Comprehensive metabolic panel  Result Value Ref Range   Sodium 139 135 - 145 mEq/L   Potassium 4.2  3.5 - 5.1 mEq/L   Chloride 102 96 - 112 mEq/L   CO2 30 19 - 32 mEq/L   Glucose, Bld 87 70 - 99 mg/dL   BUN 11 6 - 23 mg/dL   Creatinine, Ser 4.40 0.40 - 1.20 mg/dL   Total Bilirubin 0.7 0.2 - 1.2 mg/dL   Alkaline Phosphatase 79 39 - 117 U/L   AST 17 0 - 37 U/L   ALT 17 0 - 35 U/L   Total Protein 7.3 6.0 - 8.3 g/dL   Albumin 4.4 3.5 - 5.2 g/dL   GFR 34.74 >25.95 mL/min   Calcium 9.2 8.4 - 10.5 mg/dL  Lipid panel  Result Value Ref Range   Cholesterol 183 0 - 200 mg/dL   Triglycerides 638.7 (H) 0.0 - 149.0 mg/dL   HDL 56.43 >32.95 mg/dL   VLDL 18.8 0.0 - 41.6 mg/dL   LDL Cholesterol 606 (H) 0 - 99 mg/dL   Total CHOL/HDL Ratio 4    NonHDL 140.95   TSH  Result Value Ref Range   TSH 1.02 0.35 - 5.50 uIU/mL  VITAMIN D 25 Hydroxy (Vit-D Deficiency, Fractures)  Result Value Ref Range   VITD 28.56 (L) 30.00 - 100.00 ng/mL   Assessment & Plan:   Concentration deficit Assessment & Plan: Patient has ADHD testing scheduled in approximately 2 weeks.  She will reach out to them to make sure Wellbutrin will not affect the validity of the results.  Start Wellbutrin 100 mg daily.  If beneficial continue if not we will discontinue and thank about using a stimulant  Orders: -     buPROPion HCl ER (XL); Take 1 tablet (150 mg total) by mouth daily.  Dispense: 30 tablet; Refill: 0     I discussed the assessment and treatment plan with the patient. The patient was provided an opportunity to ask questions and all were answered. The patient agreed with the plan and demonstrated an understanding of the instructions. The patient was advised to call back or seek an in-person evaluation if the symptoms worsen or if the condition fails to improve as anticipated.  Follow up plan: Return in about 2 months (around 10/06/2023) for CPE and Labs.  Audria Nine, NP

## 2023-08-06 NOTE — Assessment & Plan Note (Signed)
Patient has ADHD testing scheduled in approximately 2 weeks.  She will reach out to them to make sure Wellbutrin will not affect the validity of the results.  Start Wellbutrin 100 mg daily.  If beneficial continue if not we will discontinue and thank about using a stimulant

## 2023-08-19 ENCOUNTER — Telehealth: Payer: Self-pay | Admitting: Nurse Practitioner

## 2023-08-19 DIAGNOSIS — R41840 Attention and concentration deficit: Secondary | ICD-10-CM

## 2023-08-19 NOTE — Telephone Encounter (Signed)
Pt called wanting to let Cable know that she'll e stopping buPROPion (WELLBUTRIN XL) 150 MG 24 hr tablet due to her belief of the meds not working. Pt states she is having a ADHD test done on 08/26/23 & wanted to know is Toney Reil could start her on Strattera? Call back # [phone]

## 2023-08-20 ENCOUNTER — Telehealth: Payer: Self-pay | Admitting: Nurse Practitioner

## 2023-08-20 NOTE — Telephone Encounter (Signed)
Left voicemail for patient to call the office back.

## 2023-08-20 NOTE — Telephone Encounter (Signed)
Please advise pt that this question can await Matt's return as this is not something I can answer, and is not urgent. Also routed to [facility].

## 2023-08-20 NOTE — Telephone Encounter (Signed)
See open phone note for documentation

## 2023-08-20 NOTE — Telephone Encounter (Signed)
Pt is returning your call please call her back

## 2023-08-20 NOTE — Telephone Encounter (Signed)
Called patient let her know. Advised we will reach out after Susy Frizzle has reviewed

## 2023-08-23 MED ORDER — ATOMOXETINE HCL 40 MG PO CAPS
40.0000 mg | ORAL_CAPSULE | Freq: Every day | ORAL | 0 refills | Status: DC
Start: 2023-08-23 — End: 2023-09-20

## 2023-08-23 NOTE — Telephone Encounter (Signed)
Contacted pt. No answer. Attempted to make VM, call hung up.

## 2023-08-23 NOTE — Telephone Encounter (Signed)
We can discontinue the welbutrin. I am ok trying the strattera

## 2023-08-23 NOTE — Addendum Note (Signed)
Addended by: Eden Emms on: 08/23/2023 01:26 PM   Modules accepted: Orders

## 2023-08-24 NOTE — Telephone Encounter (Signed)
Left message to return call to our office.  

## 2023-08-25 NOTE — Telephone Encounter (Signed)
Left voicemail for patient to call the office back.

## 2023-08-26 NOTE — Telephone Encounter (Signed)
Letter is fine.

## 2023-08-27 NOTE — Telephone Encounter (Signed)
Letter mailed

## 2023-08-31 ENCOUNTER — Telehealth: Payer: Self-pay | Admitting: Nurse Practitioner

## 2023-08-31 NOTE — Telephone Encounter (Signed)
Patient reached out about the PA on the [facility]

## 2023-08-31 NOTE — Telephone Encounter (Signed)
Patient called in and stated that she needs a prior auth for her atomoxetine (STRATTERA) 40 MG capsule. Thank you!

## 2023-09-02 ENCOUNTER — Other Ambulatory Visit (HOSPITAL_COMMUNITY): Payer: Self-pay

## 2023-09-03 ENCOUNTER — Other Ambulatory Visit (HOSPITAL_COMMUNITY): Payer: Self-pay

## 2023-09-03 NOTE — Telephone Encounter (Signed)
Insurance wants pt to have official diagnosis of ADD or ADHD, I see in previous notes that pt had evaluation scheduled at the end of September, does pt know if she has been formerly diagnosed yet? Would highly recommend having diagnosis prior to attempting PA, as appealing a denial may take longer than an initial PA. Thank you

## 2023-09-12 ENCOUNTER — Other Ambulatory Visit: Payer: Self-pay | Admitting: Nurse Practitioner

## 2023-09-12 DIAGNOSIS — F419 Anxiety disorder, unspecified: Secondary | ICD-10-CM

## 2023-09-12 MED ORDER — VENLAFAXINE HCL ER 75 MG PO CP24
75.0000 mg | ORAL_CAPSULE | Freq: Every day | ORAL | 3 refills | Status: DC
Start: 2023-09-12 — End: 2024-09-19

## 2023-09-20 ENCOUNTER — Other Ambulatory Visit: Payer: Self-pay | Admitting: Nurse Practitioner

## 2023-09-20 DIAGNOSIS — R41840 Attention and concentration deficit: Secondary | ICD-10-CM

## 2023-09-20 DIAGNOSIS — F988 Other specified behavioral and emotional disorders with onset usually occurring in childhood and adolescence: Secondary | ICD-10-CM

## 2023-09-20 MED ORDER — AMPHETAMINE-DEXTROAMPHETAMINE 5 MG PO TABS: 5.0000 mg | ORAL_TABLET | Freq: Two times a day (BID) | ORAL | 0 refills | Status: DC

## 2023-10-04 ENCOUNTER — Other Ambulatory Visit (HOSPITAL_COMMUNITY): Payer: Self-pay

## 2023-10-04 ENCOUNTER — Telehealth: Payer: Self-pay

## 2023-10-04 NOTE — Telephone Encounter (Signed)
Left voicemail for patient to call the office back. Generic adderall has been approved through PA.

## 2023-10-04 NOTE — Telephone Encounter (Signed)
Pharmacy Patient Advocate Encounter  Received notification from [facility] that Prior Authorization for Amphetamine-Dextroamphetamine 5MG  tablets has been APPROVED from 10-04-2023 to 10-03-2024   PA #/Case ID/Reference #: [id number]

## 2023-10-05 NOTE — Telephone Encounter (Signed)
Left voicemail for patient to call the office back.   

## 2023-10-08 NOTE — Telephone Encounter (Signed)
Called patient left message to call office. I have also called pharmacy patient picked up script on 10/22. No further action needed.

## 2023-10-22 ENCOUNTER — Encounter: Payer: No Typology Code available for payment source | Admitting: Nurse Practitioner

## 2023-12-16 ENCOUNTER — Ambulatory Visit (INDEPENDENT_AMBULATORY_CARE_PROVIDER_SITE_OTHER): Payer: 59 | Admitting: Nurse Practitioner

## 2023-12-16 ENCOUNTER — Encounter: Payer: Self-pay | Admitting: Nurse Practitioner

## 2023-12-16 ENCOUNTER — Encounter: Payer: No Typology Code available for payment source | Admitting: Nurse Practitioner

## 2023-12-16 VITALS — BP 102/70 | HR 72 | Temp 97.9°F | Ht 62.0 in | Wt 131.0 lb

## 2023-12-16 DIAGNOSIS — Z13220 Encounter for screening for lipoid disorders: Secondary | ICD-10-CM

## 2023-12-16 DIAGNOSIS — F419 Anxiety disorder, unspecified: Secondary | ICD-10-CM | POA: Diagnosis not present

## 2023-12-16 DIAGNOSIS — E559 Vitamin D deficiency, unspecified: Secondary | ICD-10-CM | POA: Diagnosis not present

## 2023-12-16 DIAGNOSIS — Z131 Encounter for screening for diabetes mellitus: Secondary | ICD-10-CM

## 2023-12-16 DIAGNOSIS — G4700 Insomnia, unspecified: Secondary | ICD-10-CM

## 2023-12-16 DIAGNOSIS — Z0000 Encounter for general adult medical examination without abnormal findings: Secondary | ICD-10-CM | POA: Diagnosis not present

## 2023-12-16 DIAGNOSIS — Z1231 Encounter for screening mammogram for malignant neoplasm of breast: Secondary | ICD-10-CM

## 2023-12-16 LAB — COMPREHENSIVE METABOLIC PANEL
ALT: 17 U/L (ref 0–35)
AST: 19 U/L (ref 0–37)
Albumin: 4.5 g/dL (ref 3.5–5.2)
Alkaline Phosphatase: 62 U/L (ref 39–117)
BUN: 12 mg/dL (ref 6–23)
CO2: 30 meq/L (ref 19–32)
Calcium: 9.2 mg/dL (ref 8.4–10.5)
Chloride: 103 meq/L (ref 96–112)
Creatinine, Ser: 0.72 mg/dL (ref 0.40–1.20)
GFR: 103.04 mL/min (ref >60.00)
Glucose, Bld: 84 mg/dL (ref 70–99)
Potassium: 3.8 meq/L (ref 3.5–5.1)
Sodium: 139 meq/L (ref 135–145)
Total Bilirubin: 0.7 mg/dL (ref 0.2–1.2)
Total Protein: 6.8 g/dL (ref 6.0–8.3)

## 2023-12-16 LAB — CBC
HCT: 37.3 % (ref 36.0–46.0)
Hemoglobin: 12.7 g/dL (ref 12.0–15.0)
MCHC: 34 g/dL (ref 30.0–36.0)
MCV: 91.9 fL (ref 78.0–100.0)
Platelets: 245 10*3/uL (ref 150.0–400.0)
RBC: 4.05 Mil/uL (ref 3.87–5.11)
RDW: 12.6 % (ref 11.5–15.5)
WBC: 5.2 10*3/uL (ref 4.0–10.5)

## 2023-12-16 LAB — LIPID PANEL
Cholesterol: 169 mg/dL (ref 0–200)
HDL: 49.3 mg/dL (ref >39.00)
LDL Cholesterol: 102 mg/dL — ABNORMAL HIGH (ref 0–99)
NonHDL: 119.53
Total CHOL/HDL Ratio: 3
Triglycerides: 86 mg/dL (ref 0.0–149.0)
VLDL: 17.2 mg/dL (ref 0.0–40.0)

## 2023-12-16 LAB — TSH: TSH: 0.81 u[IU]/mL (ref 0.35–5.50)

## 2023-12-16 LAB — HEMOGLOBIN A1C: Hgb A1c MFr Bld: 5.3 % (ref 4.6–6.5)

## 2023-12-16 LAB — VITAMIN D 25 HYDROXY (VIT D DEFICIENCY, FRACTURES): VITD: 18.22 ng/mL — ABNORMAL LOW (ref 30.00–100.00)

## 2023-12-16 NOTE — Assessment & Plan Note (Signed)
Discussed age-appropriate immunizations and screening exams.  Did review patient's personal, surgical, social, family histories.  Patient is up-to-date on all age-appropriate vaccinations she would like.  Patient too young for CRC screening.  Up-to-date on cervical cancer screening.  Order placed for mammogram today from breast cancer screening.  Patient was given information at discharge about preventative healthcare maintenance with close guidance.

## 2023-12-16 NOTE — Patient Instructions (Signed)
Nice to see you today I will be in touch with labs once I have reviewed them Follow up with me in 1 year

## 2023-12-16 NOTE — Progress Notes (Signed)
Established Patient Office Visit  Subjective   Patient ID: Felicia Cole, female    DOB: September 23, 1981  Age: 43 y.o. MRN: [id number]  Chief Complaint  Patient presents with   Annual Exam    HPI  for complete physical and follow up of chronic conditions.   Asthma: Historical diagnosis  Anxiety: Patient currently maintained on venlafaxine 75 g daily  ADD: Patient has been tested and evaluated.  She was tried on Wellbutrin in the past without great success patient take Adderall 5 mg twice daily as needed.  Insomnia: Patient currently maintained on trazodone 50 mg nightly as needed  Immunizations: -Tetanus: Completed in 2016 -Influenza:  October  -Shingles: Too young -Pneumonia: Too young  Diet: Fair diet. 2 meals a day and some snacks.  Coffee diet dr pepper and water.  Exercise: Going to the gym 2-3 times a week   Eye exam: PRK Dental exam: Completes semi-annually    Colonoscopy: Too young, currently average risk Lung Cancer Screening: N/A  Pap smear: 3-4 years ago. Was followed by GYN. [facility]. Normal and no HPV  Mammogram: Order placed for [facility]   Dexa: too young   Sleep: she will use it once a week. She is getting 8 hours of sleep. Feels rested fi she take the meds        Review of Systems  Constitutional:  Negative for chills and fever.  Respiratory:  Negative for shortness of breath.   Cardiovascular:  Negative for chest pain and leg swelling.  Gastrointestinal:  Negative for abdominal pain, blood in stool, constipation, diarrhea, nausea and vomiting.       BM daily  Genitourinary:  Negative for dysuria and hematuria.  Neurological:  Negative for dizziness, tingling and headaches.  Psychiatric/Behavioral:  Negative for hallucinations and suicidal ideas.       Objective:     BP 102/70   Pulse 72   Temp 97.9 F (36.6 C) (Oral)   Ht 5\' 2"  (1.575 m)   Wt 131 lb (59.4 kg)   SpO2 99%   BMI 23.96 kg/m  BP  Readings from Last 3 Encounters:  12/16/23 102/70  09/09/22 100/66  05/10/22 114/82   Wt Readings from Last 3 Encounters:  12/16/23 131 lb (59.4 kg)  08/06/23 131 lb (59.4 kg)  09/09/22 137 lb 6 oz (62.3 kg)   SpO2 Readings from Last 3 Encounters:  12/16/23 99%  08/06/23 99%  09/09/22 97%      Physical Exam Vitals and nursing note reviewed.  Constitutional:      Appearance: Normal appearance.  HENT:     Right Ear: Tympanic membrane, ear canal and external ear normal.     Left Ear: Tympanic membrane, ear canal and external ear normal.     Mouth/Throat:     Mouth: Mucous membranes are moist.     Pharynx: Oropharynx is clear.  Eyes:     Extraocular Movements: Extraocular movements intact.     Pupils: Pupils are equal, round, and reactive to light.  Cardiovascular:     Rate and Rhythm: Normal rate and regular rhythm.     Pulses: Normal pulses.     Heart sounds: Normal heart sounds.  Pulmonary:     Effort: Pulmonary effort is normal.     Breath sounds: Normal breath sounds.  Abdominal:     General: Bowel sounds are normal. There is no distension.     Palpations: There is no mass.     Tenderness:  There is no abdominal tenderness.     Hernia: No hernia is present.  Musculoskeletal:     Right lower leg: No edema.     Left lower leg: No edema.  Lymphadenopathy:     Cervical: No cervical adenopathy.  Skin:    General: Skin is warm.  Neurological:     General: No focal deficit present.     Mental Status: She is alert.     Deep Tendon Reflexes:     Reflex Scores:      Bicep reflexes are 2+ on the right side and 2+ on the left side.      Patellar reflexes are 2+ on the right side and 2+ on the left side.    Comments: Bilateral upper and lower extremity strength 5/5  Psychiatric:        Mood and Affect: Mood normal.        Behavior: Behavior normal.        Thought Content: Thought content normal.        Judgment: Judgment normal.      No results found for any  visits on 12/16/23.    The 10-year ASCVD risk score (Arnett DK, et al., 2019) is: 0.6%    Assessment & Plan:   Problem List Items Addressed This Visit       Other   Anxiety   Patient currently maintained on venlafaxine 75 mg daily.  Patient has HI/SI/AVH.  Continue medication as prescribed      Relevant Orders   CBC   Comprehensive metabolic panel   Vitamin D deficiency   History of the same pending vitamin D level today      Relevant Orders   VITAMIN D 25 Hydroxy (Vit-D Deficiency, Fractures)   Insomnia   Trazodone 25 mg nightly as needed.  Continue medication as needed      Relevant Orders   TSH   Preventative health care - Primary   Discussed age-appropriate immunizations and screening exams.  Did review patient's personal, surgical, social, family histories.  Patient is up-to-date on all age-appropriate vaccinations she would like.  Patient too young for CRC screening.  Up-to-date on cervical cancer screening.  Order placed for mammogram today from breast cancer screening.  Patient was given information at discharge about preventative healthcare maintenance with close guidance.      Relevant Orders   CBC   Comprehensive metabolic panel   TSH   Other Visit Diagnoses       Screening for diabetes mellitus       Relevant Orders   Hemoglobin A1c     Screening mammogram for breast cancer       Relevant Orders   MM 3D SCREENING MAMMOGRAM BILATERAL BREAST     Screening for lipid disorders       Relevant Orders   Lipid panel       Return in about 1 year (around 12/15/2024) for CPE and Labs.    Audria Nine, NP

## 2023-12-16 NOTE — Assessment & Plan Note (Signed)
Trazodone 25 mg nightly as needed.  Continue medication as needed

## 2023-12-16 NOTE — Assessment & Plan Note (Signed)
Patient currently maintained on venlafaxine 75 mg daily.  Patient has HI/SI/AVH.  Continue medication as prescribed

## 2023-12-16 NOTE — Assessment & Plan Note (Signed)
History of the same pending vitamin D level today

## 2023-12-20 ENCOUNTER — Other Ambulatory Visit: Payer: Self-pay | Admitting: Nurse Practitioner

## 2023-12-20 DIAGNOSIS — E559 Vitamin D deficiency, unspecified: Secondary | ICD-10-CM

## 2023-12-20 MED ORDER — VITAMIN D (ERGOCALCIFEROL) 1.25 MG (50000 UNIT) PO CAPS
50000.0000 [IU] | ORAL_CAPSULE | ORAL | 0 refills | Status: AC
Start: 2023-12-20 — End: ?

## 2023-12-22 ENCOUNTER — Encounter: Payer: Self-pay | Admitting: Nurse Practitioner

## 2024-01-18 IMAGING — CT CT ABD-PELV W/ CM
2 of 4 series · 14 of 46 positions shown, 16 images · IV contrast (APPLIED)
Comparison: MRI abdomen 05/03/2022. CT abdomen and pelvis
05/03/2022.

CLINICAL DATA: Acute severe epigastric pain.  ERCP yesterday.



[Series 2: axial st · axial · 0.68mm/px · z∈[-399,+41]mm · 11 of 98 slices shown, 13 images]
[im 5/98  soft-tissue]
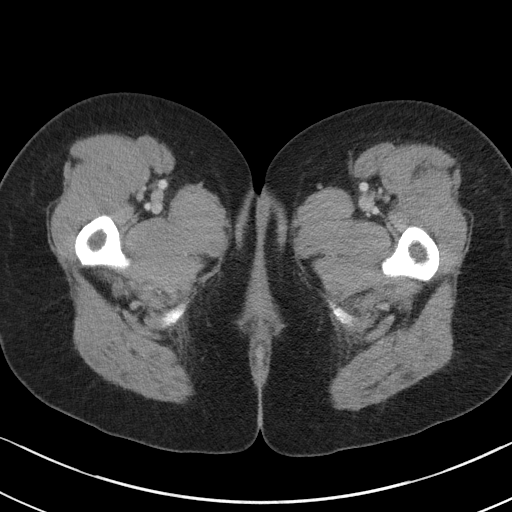
[im 5/98  bone]
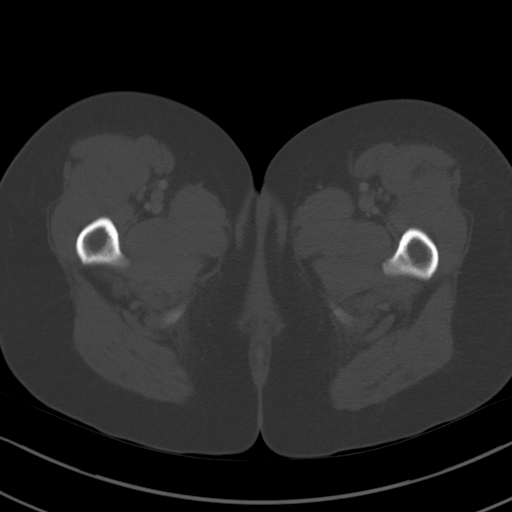
[im 15/98  soft-tissue]
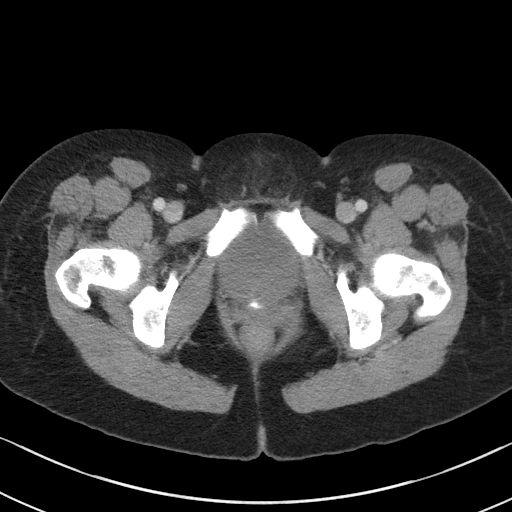
[im 25/98  soft-tissue]
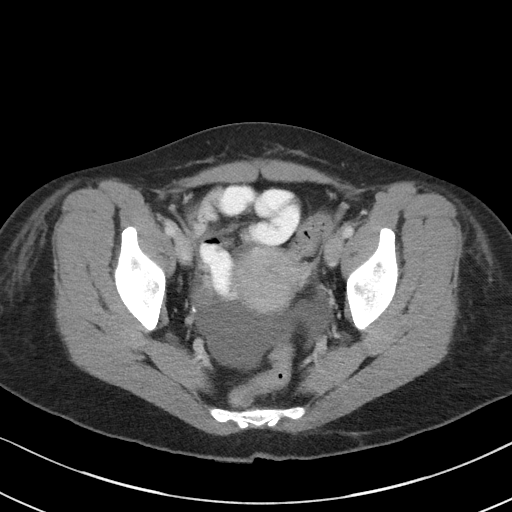
[im 34/98  soft-tissue]
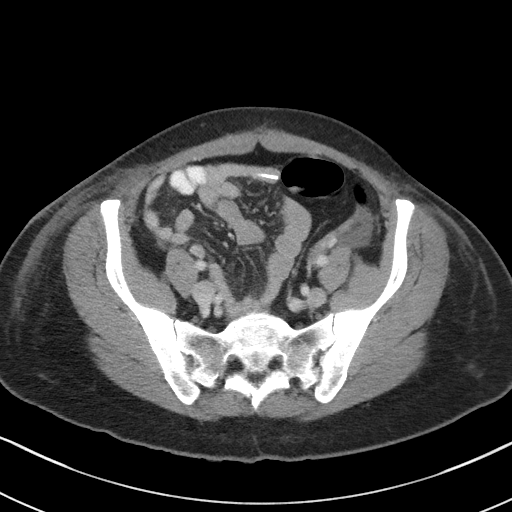
[im 39/98  soft-tissue]
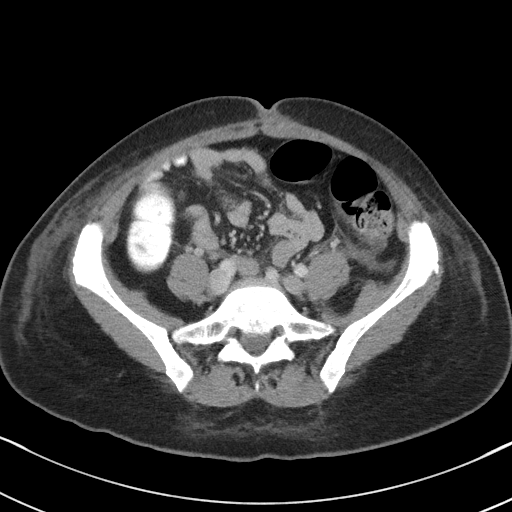
[im 49/98  soft-tissue]
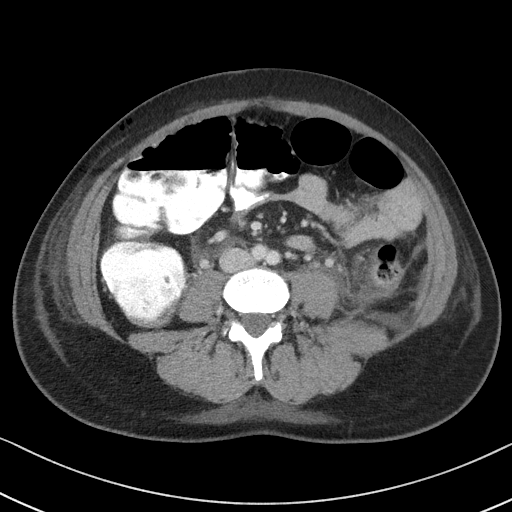
[im 59/98  soft-tissue]
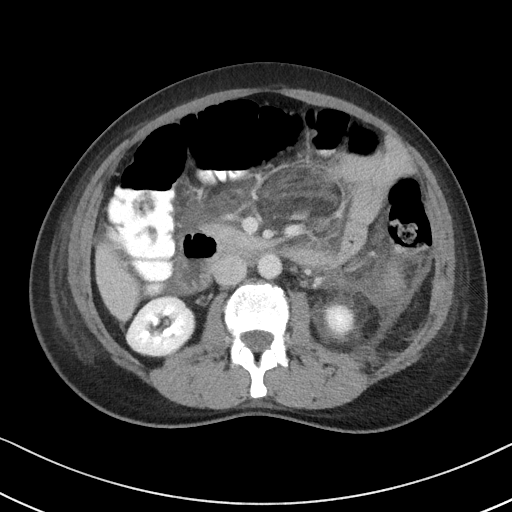
[im 64/98  soft-tissue]
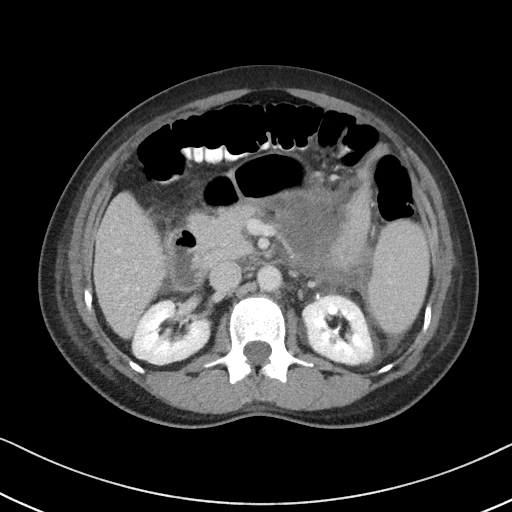
[im 73/98  soft-tissue]
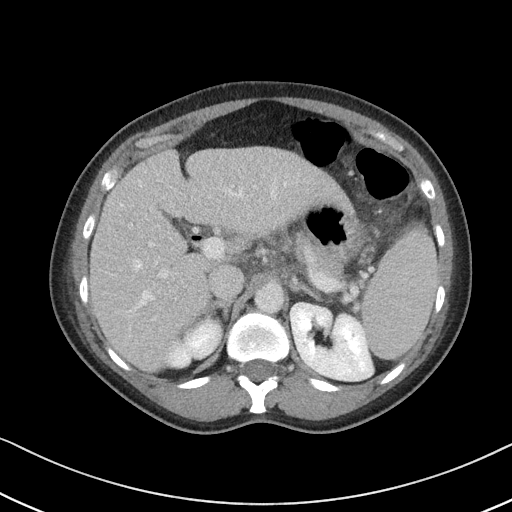
[im 73/98  bone]
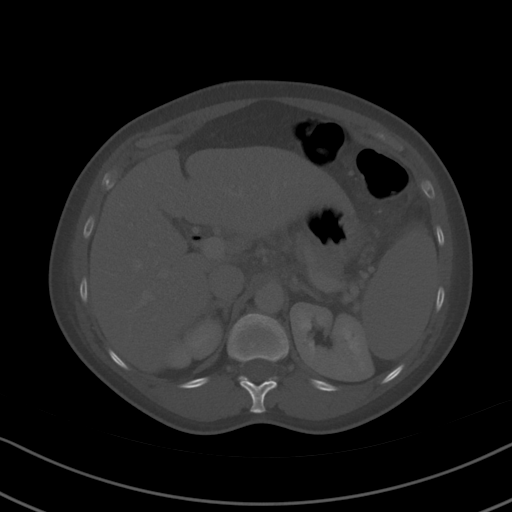
[im 83/98  soft-tissue]
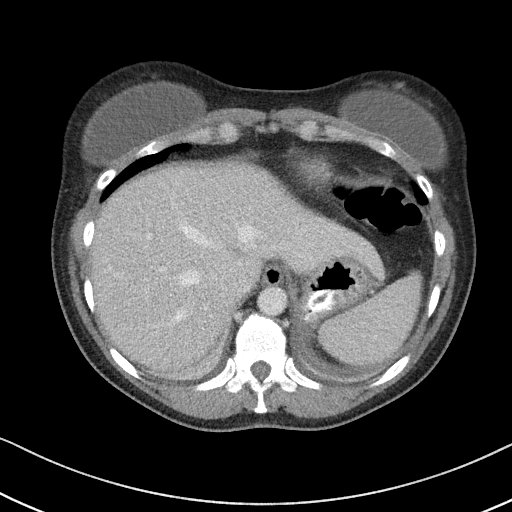
[im 93/98  soft-tissue]
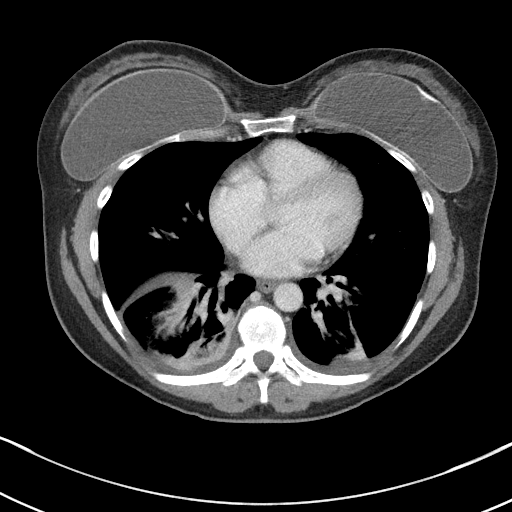

[Series 5: coronal st · coronal · 0.73mm/px · 3 of 90 slices shown]
[im 30/90  soft-tissue]
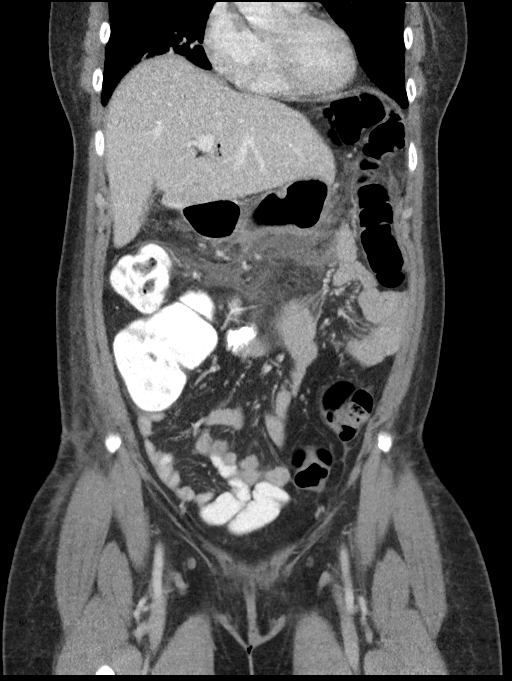
[im 40/90  soft-tissue]
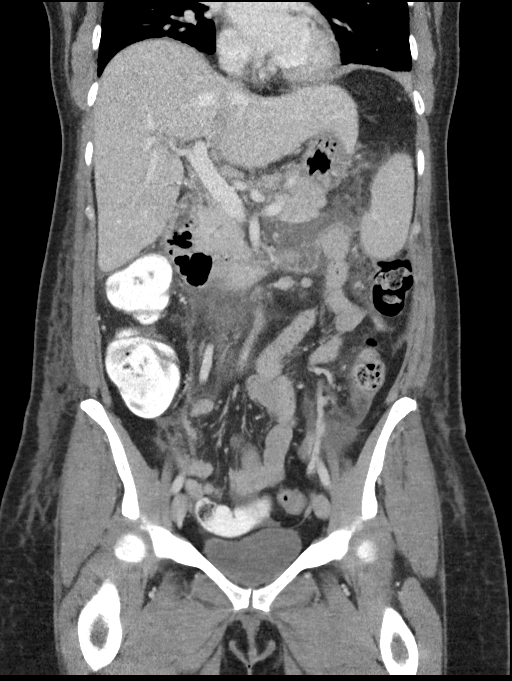
[im 50/90  soft-tissue]
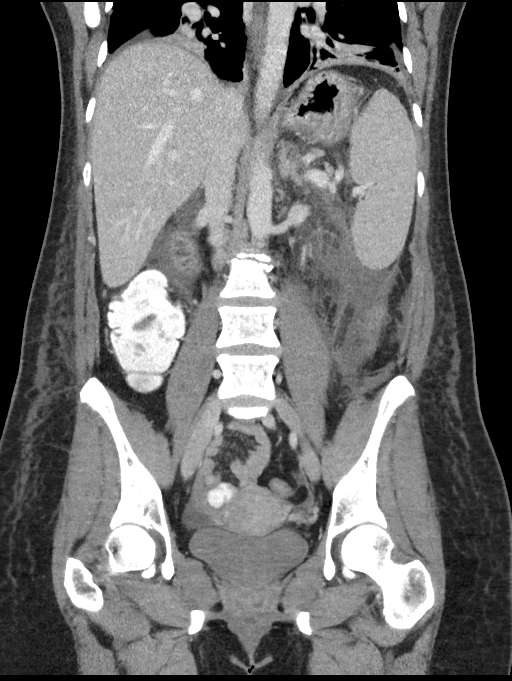

[14 of 46 positions shown; findings below may reference images not displayed]

RADIATION DOSE REDUCTION: This exam was performed according to the
departmental dose-optimization program which includes automated
exposure control, adjustment of the mA and/or kV according to
patient size and/or use of iterative reconstruction technique.

CONTRAST:  80mL OMNIPAQUE IOHEXOL 350 MG/ML SOLN
FINDINGS: CTA CHEST FINDINGS

Cardiovascular: Satisfactory opacification of the pulmonary arteries
to the segmental level. No evidence of pulmonary embolism. Normal
heart size. No pericardial effusion.

Mediastinum/Nodes: No enlarged mediastinal, hilar, or axillary lymph
nodes. Thyroid gland, trachea, and esophagus demonstrate no
significant findings.

Lungs/Pleura: There are small bilateral pleural effusions. There is
bilateral lower lobe patchy airspace disease. No evidence for
pneumothorax. Trachea and central airways are patent.

Musculoskeletal: Bilateral breast implants are present. No acute
fractures.

Review of the MIP images confirms the above findings.

CT ABDOMEN and PELVIS FINDINGS

Hepatobiliary: Patient is status post cholecystectomy. There is no
fluid collection in the gallbladder fossa. Small amount of
pneumobilia is present likely related to recent ERCP. There is no
biliary ductal dilatation.

Pancreas: There is mild diffuse fat stranding and fluid surrounding
the pancreas. The pancreas enhances normally. There is no ductal
dilatation. No enhancing fluid collections are identified.

Spleen: Normal in size without focal abnormality.

Adrenals/Urinary Tract: Adrenal glands are unremarkable. Kidneys are
normal, without renal calculi, focal lesion, or hydronephrosis.
Bladder is unremarkable.

Stomach/Bowel: There is no evidence for bowel obstruction. The
appendix is within normal limits. There is no focal wall thickening
identified. The stomach is nondilated. There is a trace amount of
free air adjacent to the appendix image [DATE]. Catheter fragment is
seen within nondilated small bowel in the mid abdomen image 2/66.
This measures approximally 4 cm in length.

Vascular/Lymphatic: No significant vascular findings are present. No
enlarged abdominal or pelvic lymph nodes.

Reproductive: Uterus and bilateral adnexa are unremarkable.

Other: There is a small amount of free fluid in the pelvis. No focal
abdominal wall hernia. There is small amount of air in the anterior
abdominal wall compatible with recent surgery. There is also some
mild body wall edema likely related to recent surgery.

Musculoskeletal: No acute or significant osseous findings.

Review of the MIP images confirms the above findings.
IMPRESSION: 1. No evidence for pulmonary embolism.
2. Trace bilateral pleural effusions.
3. Patchy atelectasis and airspace disease in the bilateral lower
lobes. Correlate clinically for pneumonia.
4. Findings compatible with acute pancreatitis. No enhancing fluid
collections or pancreatic necrosis.
5. Trace free air adjacent to the pancreas is most likely related to
recent abdominal surgery. Correlate clinically for other etiologies.
6. Status post cholecystectomy. No fluid collection in the
gallbladder fossa. Pneumobilia.
7. New catheter fragment measuring 4 cm identified in mid small
bowel. Please correlate clinically. No bowel obstruction.
8. Small amount of ascites.

## 2024-01-20 IMAGING — DX DG ABDOMEN 1V
1 series · 1 of 1 positions shown · non-contrast
Comparison: CT abdomen 05/06/2022

CLINICAL DATA: Severe epigastric pain.

EXAM:
ABDOMEN - 1 VIEW

[abdomen kub]
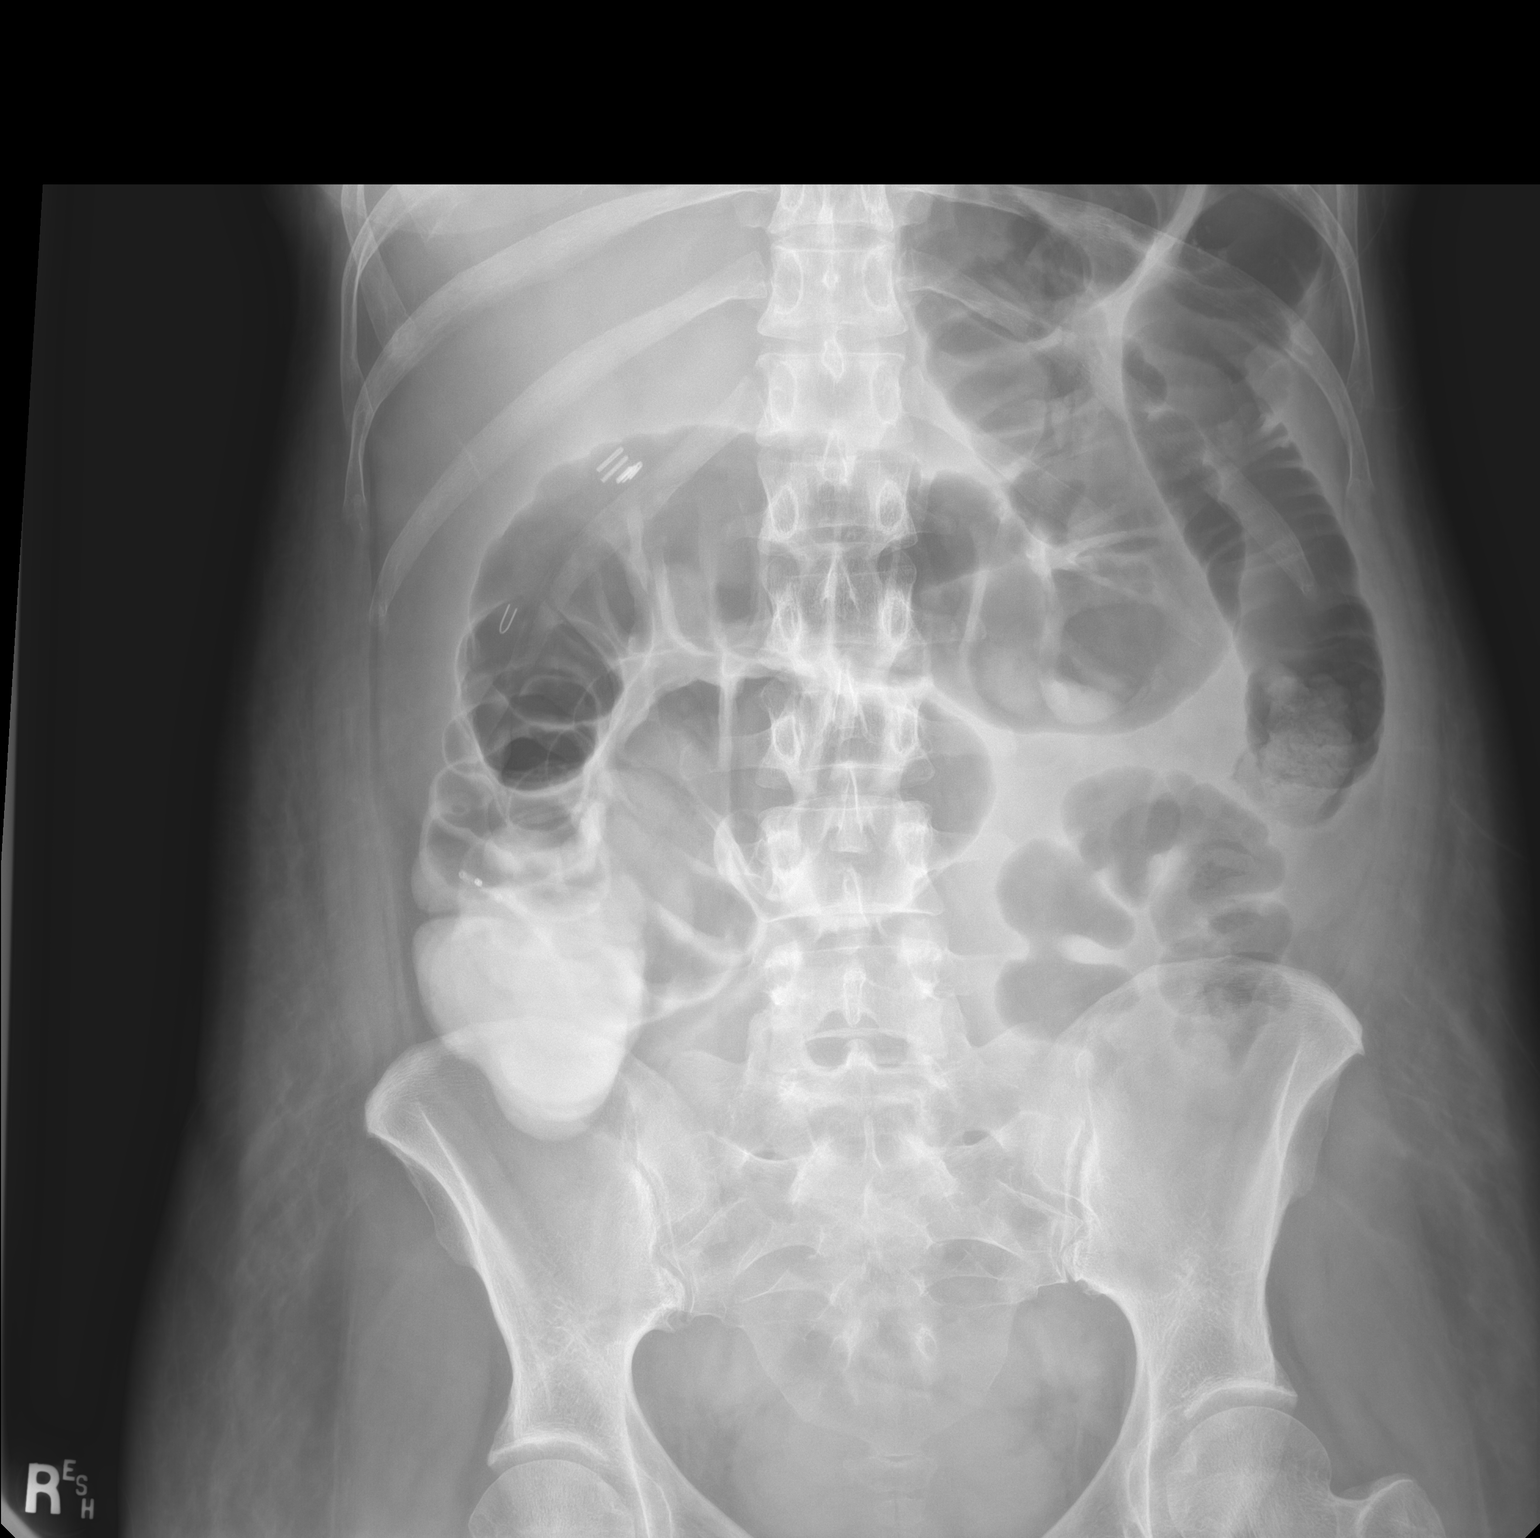

[1 of 1 positions shown; findings below may reference images not displayed]

FINDINGS: Gaseous distension of the colon with oral contrast in the ascending
colon. No bowel dilatation to suggest obstruction. No evidence of
pneumoperitoneum, portal venous gas or pneumatosis.

No pathologic calcifications along the expected course of the
ureters.

No acute osseous abnormality.
IMPRESSION: 1. No evidence of bowel obstruction.

## 2024-06-20 LAB — HM MAMMOGRAPHY

## 2024-08-15 ENCOUNTER — Telehealth: Payer: Self-pay | Admitting: Nurse Practitioner

## 2024-08-15 NOTE — Telephone Encounter (Signed)
 Left voicemail for patient to call the office back.

## 2024-08-15 NOTE — Telephone Encounter (Signed)
 Can we call and and get when she got her flu shot from [facility] in [place]. Then update her shot record. Once that is done can we print it out for me please

## 2024-08-16 NOTE — Telephone Encounter (Signed)
 Was able to confirm, via [facility], pt had flu shot on 08/10/24.   Updated pt's chart and printed record from [facility].

## 2024-09-19 ENCOUNTER — Other Ambulatory Visit: Payer: Self-pay | Admitting: Nurse Practitioner

## 2024-09-19 DIAGNOSIS — F419 Anxiety disorder, unspecified: Secondary | ICD-10-CM

## 2024-09-19 NOTE — Telephone Encounter (Signed)
 Call pt number not working

## 2024-09-19 NOTE — Telephone Encounter (Signed)
 Patient is due for her CPE 12/16/2024 or slightly after. Can we call and get her on  the schedule  please

## 2024-12-21 ENCOUNTER — Other Ambulatory Visit

## 2024-12-21 ENCOUNTER — Ambulatory Visit (INDEPENDENT_AMBULATORY_CARE_PROVIDER_SITE_OTHER): Admitting: Nurse Practitioner

## 2024-12-21 ENCOUNTER — Encounter: Payer: Self-pay | Admitting: Nurse Practitioner

## 2024-12-21 ENCOUNTER — Other Ambulatory Visit: Payer: Self-pay | Admitting: Nurse Practitioner

## 2024-12-21 VITALS — BP 98/60 | HR 93 | Temp 97.6°F | Ht 61.5 in | Wt 134.8 lb

## 2024-12-21 DIAGNOSIS — Z0000 Encounter for general adult medical examination without abnormal findings: Secondary | ICD-10-CM

## 2024-12-21 DIAGNOSIS — E559 Vitamin D deficiency, unspecified: Secondary | ICD-10-CM

## 2024-12-21 DIAGNOSIS — Z111 Encounter for screening for respiratory tuberculosis: Secondary | ICD-10-CM

## 2024-12-21 DIAGNOSIS — Z131 Encounter for screening for diabetes mellitus: Secondary | ICD-10-CM

## 2024-12-21 DIAGNOSIS — E7800 Pure hypercholesterolemia, unspecified: Secondary | ICD-10-CM | POA: Diagnosis not present

## 2024-12-21 DIAGNOSIS — F419 Anxiety disorder, unspecified: Secondary | ICD-10-CM

## 2024-12-21 DIAGNOSIS — J4520 Mild intermittent asthma, uncomplicated: Secondary | ICD-10-CM

## 2024-12-21 DIAGNOSIS — G4700 Insomnia, unspecified: Secondary | ICD-10-CM

## 2024-12-21 LAB — COMPREHENSIVE METABOLIC PANEL WITH GFR
ALT: 20 U/L (ref 3–35)
AST: 21 U/L (ref 5–37)
Albumin: 4.5 g/dL (ref 3.5–5.2)
Alkaline Phosphatase: 67 U/L (ref 39–117)
BUN: 12 mg/dL (ref 6–23)
CO2: 28 meq/L (ref 19–32)
Calcium: 9 mg/dL (ref 8.4–10.5)
Chloride: 102 meq/L (ref 96–112)
Creatinine, Ser: 0.73 mg/dL (ref 0.40–1.20)
GFR: 100.63 mL/min
Glucose, Bld: 75 mg/dL (ref 70–99)
Potassium: 3.7 meq/L (ref 3.5–5.1)
Sodium: 137 meq/L (ref 135–145)
Total Bilirubin: 0.6 mg/dL (ref 0.2–1.2)
Total Protein: 7.2 g/dL (ref 6.0–8.3)

## 2024-12-21 LAB — LIPID PANEL
Cholesterol: 147 mg/dL (ref 28–200)
HDL: 42 mg/dL
LDL Cholesterol: 83 mg/dL (ref 10–99)
NonHDL: 105.09
Total CHOL/HDL Ratio: 4
Triglycerides: 110 mg/dL (ref 10.0–149.0)
VLDL: 22 mg/dL (ref 0.0–40.0)

## 2024-12-21 LAB — CBC WITH DIFFERENTIAL/PLATELET
Basophils Absolute: 0.1 K/uL (ref 0.0–0.1)
Basophils Relative: 0.6 % (ref 0.0–3.0)
Eosinophils Absolute: 0.3 K/uL (ref 0.0–0.7)
Eosinophils Relative: 3.1 % (ref 0.0–5.0)
HCT: 37.4 % (ref 36.0–46.0)
Hemoglobin: 12.9 g/dL (ref 12.0–15.0)
Lymphocytes Relative: 21.7 % (ref 12.0–46.0)
Lymphs Abs: 1.8 K/uL (ref 0.7–4.0)
MCHC: 34.7 g/dL (ref 30.0–36.0)
MCV: 88.5 fl (ref 78.0–100.0)
Monocytes Absolute: 0.5 K/uL (ref 0.1–1.0)
Monocytes Relative: 6.2 % (ref 3.0–12.0)
Neutro Abs: 5.8 K/uL (ref 1.4–7.7)
Neutrophils Relative %: 68.4 % (ref 43.0–77.0)
Platelets: 264 K/uL (ref 150.0–400.0)
RBC: 4.22 Mil/uL (ref 3.87–5.11)
RDW: 13.5 % (ref 11.5–15.5)
WBC: 8.5 K/uL (ref 4.0–10.5)

## 2024-12-21 LAB — VITAMIN D 25 HYDROXY (VIT D DEFICIENCY, FRACTURES): VITD: 23.3 ng/mL — ABNORMAL LOW (ref 30.00–100.00)

## 2024-12-21 LAB — HEMOGLOBIN A1C: Hgb A1c MFr Bld: 4.9 % (ref 4.6–6.5)

## 2024-12-21 LAB — TSH: TSH: 1.23 u[IU]/mL (ref 0.35–5.50)

## 2024-12-21 NOTE — Assessment & Plan Note (Signed)
 History of same.  Patient currently maintained on venlafaxine  75 mL daily.  Patient still having some breakthrough anxiety.  Has done therapy in the past.  Patient stable no HI/SI/AVH.  Continue medication as prescribed

## 2024-12-21 NOTE — Assessment & Plan Note (Signed)
 History of same.  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  Continue

## 2024-12-21 NOTE — Assessment & Plan Note (Signed)
 History of same pending vitamin D  level today.

## 2024-12-21 NOTE — Assessment & Plan Note (Signed)
 Discussed age-appropriate immunizations and screening exams.  Did review patient's personal, surgical, social, family histories.  Patient is up-to-date with all age-appropriate vaccinations she would like.  Patient is young for CRC screening.  Patient up-to-date on Pap smear and mammogram.  Patient was given information at discharge about preventative healthcare maintenance with anticipatory guidance.

## 2024-12-21 NOTE — Progress Notes (Signed)
 "  Established Patient Office Visit  Subjective   Patient ID: Felicia Cole, female    DOB: January 10, 1981  Age: 44 y.o. MRN: [id number]  Chief Complaint  Patient presents with   Annual Exam    HPI  Anxiety: Currently maintained on venlafaxine  75 mg daily. States that she Is having daily anxiety. She was doing therapy for approx 6 months.   Insomnia: Currently maintained on trazodone  25 to 50 mg nightly as needed  Asthma: Maintained on Zyrtec 10 mg daily. States in [place] and college.  Exercise induced asthma.  Has not had trouble as an adult.  Does not have an inhaler at home  for complete physical and follow up of chronic conditions.  Immunizations: -Tetanus: Completed in 2016 -Influenza: 08/10/2024 -Shingles: Too young -Pneumonia: Declined - HPV: up to date   Diet: Fair diet. She is eating 2 meals a day and will snack sometimes. Drinks coffee, water, diet soda and tea  Exercise: No regular exercise. Intermittently  Eye exam: TRK PRN Dental exam: Completes semi-annually    Colonoscopy: Too young, currently average risk Lung Cancer Screening: N/A  Pap smear: Up-to-date through GYN  Mammogram: 06/20/2024  DEXA: Too young        Review of Systems  Constitutional:  Negative for chills and fever.  Respiratory:  Negative for shortness of breath.   Cardiovascular:  Negative for chest pain and leg swelling.  Gastrointestinal:  Negative for abdominal pain, blood in stool, constipation, diarrhea, nausea and vomiting.  Genitourinary:  Negative for dysuria and hematuria.  Neurological:  Negative for tingling and headaches.  Psychiatric/Behavioral:  Negative for hallucinations and suicidal ideas.       Objective:     BP 98/60   Pulse 93   Temp 97.6 F (36.4 C) (Oral)   Ht 5' 1.5 (1.562 m)   Wt 134 lb 12.8 oz (61.1 kg)   SpO2 98%   BMI 25.06 kg/m  BP Readings from Last 3 Encounters:  12/21/24 98/60  12/16/23 102/70  09/09/22 100/66   Wt Readings from  Last 3 Encounters:  12/21/24 134 lb 12.8 oz (61.1 kg)  12/16/23 131 lb (59.4 kg)  08/06/23 131 lb (59.4 kg)   SpO2 Readings from Last 3 Encounters:  12/21/24 98%  12/16/23 99%  08/06/23 99%      Physical Exam Vitals and nursing note reviewed.  Constitutional:      Appearance: Normal appearance.  HENT:     Right Ear: Tympanic membrane, ear canal and external ear normal.     Left Ear: Tympanic membrane, ear canal and external ear normal.     Mouth/Throat:     Mouth: Mucous membranes are moist.     Pharynx: Oropharynx is clear.  Eyes:     Extraocular Movements: Extraocular movements intact.     Pupils: Pupils are equal, round, and reactive to light.  Cardiovascular:     Rate and Rhythm: Normal rate and regular rhythm.     Pulses: Normal pulses.     Heart sounds: Normal heart sounds.  Pulmonary:     Effort: Pulmonary effort is normal.     Breath sounds: Normal breath sounds.  Abdominal:     General: Bowel sounds are normal. There is no distension.     Palpations: There is no mass.     Tenderness: There is no abdominal tenderness.     Hernia: No hernia is present.  Musculoskeletal:     Right lower leg: No edema.  Left lower leg: No edema.  Lymphadenopathy:     Cervical: No cervical adenopathy.  Skin:    General: Skin is warm.  Neurological:     General: No focal deficit present.     Mental Status: She is alert.     Deep Tendon Reflexes:     Reflex Scores:      Bicep reflexes are 2+ on the right side and 2+ on the left side.      Patellar reflexes are 2+ on the right side and 2+ on the left side.    Comments: Bilateral upper and lower extremity strength 5/5  Psychiatric:        Mood and Affect: Mood normal.        Behavior: Behavior normal.        Thought Content: Thought content normal.        Judgment: Judgment normal.      No results found for any visits on 12/21/24.    The 10-year ASCVD risk score (Arnett DK, et al., 2019) is: 0.4%    Assessment &  Plan:   Problem List Items Addressed This Visit       Respiratory   Asthma   History of same.  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  Continue        Other   Anxiety   History of same.  Patient currently maintained on venlafaxine  75 mL daily.  Patient still having some breakthrough anxiety.  Has done therapy in the past.  Patient stable no HI/SI/AVH.  Continue medication as prescribed      Vitamin D  deficiency   History of same pending vitamin D  level today      Preventative health care - Primary   Discussed age-appropriate immunizations and screening exams.  Did review patient's personal, surgical, social, family histories.  Patient is up-to-date with all age-appropriate vaccinations she would like.  Patient is young for CRC screening.  Patient up-to-date on Pap smear and mammogram.  Patient was given information at discharge about preventative healthcare maintenance with anticipatory guidance.       Return in about 1 year (around 12/21/2025) for CPE and Labs.    Adina Crandall, NP  "

## 2024-12-21 NOTE — Patient Instructions (Signed)
 Nice to see you today I will be in touch with labs once I have reviewed them Follow up in 1 year, sooner if you need me

## 2024-12-24 ENCOUNTER — Ambulatory Visit: Payer: Self-pay | Admitting: Nurse Practitioner

## 2024-12-24 DIAGNOSIS — Z0184 Encounter for antibody response examination: Secondary | ICD-10-CM

## 2024-12-24 LAB — QUANTIFERON-TB GOLD PLUS
Mitogen-NIL: >10 [IU]/mL
NIL: 0.07 [IU]/mL
QuantiFERON-TB Gold Plus: NEGATIVE
TB1-NIL: 0.01 [IU]/mL
TB2-NIL: 0.05 [IU]/mL

## 2024-12-26 ENCOUNTER — Telehealth: Payer: Self-pay | Admitting: Nurse Practitioner

## 2024-12-26 NOTE — Telephone Encounter (Signed)
 Copied from CRM #[id number]. Topic: Clinical - Lab/Test Results >> Dec 25, 2024  3:56 PM Macario HERO wrote: Reason for CRM: Patient called regarding TB test results copy. Advised will be mailed out once the clinic reopens.

## 2024-12-27 NOTE — Telephone Encounter (Signed)
 Left detailed voicemail for patient to call the office back if there are any questions or concerns.

## 2025-01-01 ENCOUNTER — Telehealth: Payer: Self-pay | Admitting: *Deleted

## 2025-01-01 NOTE — Telephone Encounter (Signed)
 Copied from CRM [id number]. Topic: General - Other >> Jan 01, 2025 10:29 AM Deleta RAMAN wrote: Reason for CRM: patient has concerns about vaccine she would to know the date they were completed. States they are not on shot record. Please contact [phone]. Vaccines are MMR and Varicella

## 2025-01-02 NOTE — Telephone Encounter (Signed)
 Left detailed voicemail for patient

## 2025-01-05 ENCOUNTER — Encounter: Payer: Self-pay | Admitting: Radiology

## 2025-01-05 ENCOUNTER — Other Ambulatory Visit: Payer: Self-pay | Admitting: Nurse Practitioner

## 2025-01-05 DIAGNOSIS — Z0184 Encounter for antibody response examination: Secondary | ICD-10-CM

## 2025-01-31 ENCOUNTER — Encounter: Admitting: Nurse Practitioner
